# Patient Record
Sex: Female | Born: 1970 | Race: White | Hispanic: No | Marital: Single | Smoking: Never smoker
Health system: Southern US, Community
[De-identification: ages and names within clinical notes are randomized; demographics above are authoritative.]

## PROBLEM LIST (undated history)

## (undated) DIAGNOSIS — R1310 Dysphagia, unspecified: Secondary | ICD-10-CM

## (undated) DIAGNOSIS — Z8719 Personal history of other diseases of the digestive system: Secondary | ICD-10-CM

## (undated) DIAGNOSIS — M810 Age-related osteoporosis without current pathological fracture: Secondary | ICD-10-CM

## (undated) DIAGNOSIS — F419 Anxiety disorder, unspecified: Secondary | ICD-10-CM

## (undated) DIAGNOSIS — R519 Headache, unspecified: Secondary | ICD-10-CM

## (undated) DIAGNOSIS — F329 Major depressive disorder, single episode, unspecified: Secondary | ICD-10-CM

## (undated) DIAGNOSIS — G4730 Sleep apnea, unspecified: Secondary | ICD-10-CM

## (undated) DIAGNOSIS — F32A Depression, unspecified: Secondary | ICD-10-CM

## (undated) DIAGNOSIS — Z9889 Other specified postprocedural states: Secondary | ICD-10-CM

## (undated) DIAGNOSIS — K219 Gastro-esophageal reflux disease without esophagitis: Secondary | ICD-10-CM

## (undated) DIAGNOSIS — Z8711 Personal history of peptic ulcer disease: Secondary | ICD-10-CM

## (undated) DIAGNOSIS — E7800 Pure hypercholesterolemia, unspecified: Secondary | ICD-10-CM

## (undated) DIAGNOSIS — I10 Essential (primary) hypertension: Secondary | ICD-10-CM

## (undated) DIAGNOSIS — K589 Irritable bowel syndrome without diarrhea: Secondary | ICD-10-CM

## (undated) DIAGNOSIS — R51 Headache: Secondary | ICD-10-CM

## (undated) DIAGNOSIS — F509 Eating disorder, unspecified: Secondary | ICD-10-CM

## (undated) DIAGNOSIS — M1990 Unspecified osteoarthritis, unspecified site: Secondary | ICD-10-CM

## (undated) DIAGNOSIS — R112 Nausea with vomiting, unspecified: Secondary | ICD-10-CM

## (undated) HISTORY — PX: ESOPHAGEAL DILATION: SHX303

## (undated) HISTORY — DX: Unspecified osteoarthritis, unspecified site: M19.90

## (undated) HISTORY — PX: KNEE SURGERY: SHX244

## (undated) HISTORY — PX: MULTIPLE TOOTH EXTRACTIONS: SHX2053

## (undated) HISTORY — DX: Sleep apnea, unspecified: G47.30

## (undated) HISTORY — PX: CHOLECYSTECTOMY: SHX55

## (undated) HISTORY — DX: Personal history of other diseases of the digestive system: Z87.19

## (undated) HISTORY — DX: Irritable bowel syndrome, unspecified: K58.9

---

## 2002-07-14 ENCOUNTER — Emergency Department (HOSPITAL_COMMUNITY): Admission: EM | Admit: 2002-07-14 | Discharge: 2002-07-14 | Payer: Self-pay | Admitting: Emergency Medicine

## 2002-08-02 ENCOUNTER — Emergency Department (HOSPITAL_COMMUNITY): Admission: EM | Admit: 2002-08-02 | Discharge: 2002-08-02 | Payer: Self-pay | Admitting: Emergency Medicine

## 2002-08-02 ENCOUNTER — Encounter: Payer: Self-pay | Admitting: Emergency Medicine

## 2004-03-30 ENCOUNTER — Emergency Department (HOSPITAL_COMMUNITY): Admission: EM | Admit: 2004-03-30 | Discharge: 2004-03-30 | Payer: Self-pay | Admitting: Emergency Medicine

## 2007-10-16 ENCOUNTER — Ambulatory Visit (HOSPITAL_COMMUNITY): Admission: RE | Admit: 2007-10-16 | Discharge: 2007-10-16 | Payer: Self-pay | Admitting: Family Medicine

## 2012-12-16 ENCOUNTER — Emergency Department (HOSPITAL_COMMUNITY)
Admission: EM | Admit: 2012-12-16 | Discharge: 2012-12-17 | Disposition: A | Discharge status: Home / Self Care | Payer: BC Managed Care – PPO | Attending: Emergency Medicine | Admitting: Emergency Medicine

## 2012-12-16 ENCOUNTER — Encounter (HOSPITAL_COMMUNITY): Payer: Self-pay | Admitting: *Deleted

## 2012-12-16 ENCOUNTER — Emergency Department (HOSPITAL_COMMUNITY): Payer: BC Managed Care – PPO

## 2012-12-16 DIAGNOSIS — S9032XA Contusion of left foot, initial encounter: Secondary | ICD-10-CM

## 2012-12-16 DIAGNOSIS — Z87891 Personal history of nicotine dependence: Secondary | ICD-10-CM | POA: Insufficient documentation

## 2012-12-16 DIAGNOSIS — Z8781 Personal history of (healed) traumatic fracture: Secondary | ICD-10-CM | POA: Insufficient documentation

## 2012-12-16 DIAGNOSIS — W1692XA Jumping or diving into unspecified water causing other injury, initial encounter: Secondary | ICD-10-CM | POA: Insufficient documentation

## 2012-12-16 DIAGNOSIS — Y92009 Unspecified place in unspecified non-institutional (private) residence as the place of occurrence of the external cause: Secondary | ICD-10-CM | POA: Insufficient documentation

## 2012-12-16 DIAGNOSIS — Y9339 Activity, other involving climbing, rappelling and jumping off: Secondary | ICD-10-CM | POA: Insufficient documentation

## 2012-12-16 DIAGNOSIS — S9030XA Contusion of unspecified foot, initial encounter: Secondary | ICD-10-CM | POA: Insufficient documentation

## 2012-12-16 MED ORDER — OXYCODONE-ACETAMINOPHEN 5-325 MG PO TABS: 1.0000 | ORAL_TABLET | Freq: Four times a day (QID) | ORAL | Status: DC | PRN

## 2012-12-16 MED ORDER — IBUPROFEN 800 MG PO TABS: 800.0000 mg | ORAL_TABLET | Freq: Three times a day (TID) | ORAL | Status: DC

## 2012-12-16 MED ORDER — TRAMADOL HCL 50 MG PO TABS
50.0000 mg | ORAL_TABLET | Freq: Once | ORAL | Status: AC
  Administered 2012-12-16: 50 mg via ORAL
  Filled 2012-12-16: qty 1

## 2012-12-16 NOTE — ED Notes (Signed)
C/o left foot pain and not able to ambulate on it after jumping into a shallow pool flat footed today, has ice on area PTA

## 2012-12-16 NOTE — ED Provider Notes (Signed)
History    CSN: [id number] Arrival date & time 12/16/12  2158  First MD Initiated Contact with Patient 12/16/12 2242     Chief Complaint  Patient presents with  . Foot Injury   (Consider location/radiation/quality/duration/timing/severity/associated sxs/prior Treatment) Patient is a 42 y.o. female presenting with foot injury. The history is provided by the patient.  Foot Injury Location:  Foot Injury: yes   Mechanism of injury comment:  Jumped 3 ft  into a swimming pool and landed on her feet , with most of her weight on the left foot. Foot location:  L foot Pain details:    Quality:  Throbbing   Radiates to:  Does not radiate   Severity:  Moderate   Onset quality:  Sudden   Timing:  Constant Chronicity:  New Dislocation: no   Foreign body present:  No foreign bodies Prior injury to area:  Yes Worsened by:  Bearing weight Ineffective treatments:  NSAIDs Associated symptoms: decreased ROM and swelling   Associated symptoms: no back pain, no fever, no neck pain, no numbness, no stiffness and no tingling   Risk factors comment:  Hx of foot fracture 20 yrs ago.  History reviewed. No pertinent past medical history. Past Surgical History  Procedure Laterality Date  . Cholecystectomy     History reviewed. No pertinent family history. History  Substance Use Topics  . Smoking status: Former Games developer  . Smokeless tobacco: Not on file  . Alcohol Use: No   OB History   Grav Para Term Preterm Abortions TAB SAB Ect Mult Living                 Review of Systems  Constitutional: Negative for fever and chills.  HENT: Negative for neck pain.   Genitourinary: Negative for dysuria and difficulty urinating.  Musculoskeletal: Positive for joint swelling and arthralgias. Negative for back pain and stiffness.  Skin: Negative for color change and wound.  Neurological: Negative for dizziness, weakness and numbness.  All other systems reviewed and are negative.    Allergies   Vicodin and Sulfa antibiotics  Home Medications   Current Outpatient Rx  Name  Route  Sig  Dispense  Refill  . ibuprofen (ADVIL,MOTRIN) 200 MG tablet   Oral   Take 800 mg by mouth every 6 (six) hours as needed for pain.          BP 127/69  Pulse 71  Temp(Src) 97.8 F (36.6 C) (Oral)  Resp 18  Ht 5' 2.5" (1.588 m)  Wt 175 lb (79.379 kg)  BMI 31.48 kg/m2  SpO2 100%  LMP 12/10/2012 Physical Exam  Nursing note and vitals reviewed. Constitutional: She is oriented to person, place, and time. She appears well-developed and well-nourished. No distress.  HENT:  Head: Normocephalic and atraumatic.  Cardiovascular: Normal rate, regular rhythm, normal heart sounds and intact distal pulses.   No murmur heard. Pulmonary/Chest: Effort normal and breath sounds normal. No respiratory distress.  Musculoskeletal: She exhibits tenderness. She exhibits no edema.       Left foot: She exhibits tenderness. She exhibits no swelling, normal capillary refill, no crepitus, no deformity and no laceration.  ttp of the plantar surface of the left foot.  No significant edema, no bruising.   DP pulse is brisk, distal sensation intact.  Compartments soft, No erythema, abrasion, bruising or bony deformity of the foot.    Neurological: She is alert and oriented to person, place, and time. She exhibits normal muscle tone. Coordination normal.  Skin: Skin is warm and dry.    ED Course  Procedures (including critical care time) Labs Reviewed - No data to display Dg Foot Complete Left  12/16/2012   *RADIOLOGY REPORT*  Clinical Data: Injury as the patient jumped into the swimming pull. Plantar foot pain from the central large to the calcaneus.  LEFT FOOT - COMPLETE 3+ VIEW  Comparison: None.  Findings: Hallux valgus deformity and degenerative changes of the first metatarsophalangeal joint.  Plantar calcaneal spur.  Left foot appears otherwise intact. No evidence of acute fracture or subluxation.  No focal bone  lesions.  Bone matrix and cortex appear intact.  No abnormal radiopaque densities in the soft tissues.  IMPRESSION: No acute bony abnormalities demonstrated in the left foot.   Original Report Authenticated By: Burman Nieves, M.D.       MDM  Patient has ttp along the plantar surface of the lateral left foot.  NV intact,  No proximal tenderness.  No spinal tenderness.  X-ray tonight is neg for acute fx.    Post op shoe applied and pt given crutches.  She agrees to elevate, ice and close f/u with her orthopedist in [place].    Shila Kruczek L. Cecil Bixby, PA-C 12/16/12 2346

## 2012-12-16 NOTE — ED Provider Notes (Signed)
Medical screening examination/treatment/procedure(s) were performed by non-physician practitioner and as supervising physician I was immediately available for consultation/collaboration.   Shelda Jakes, MD 12/16/12 [phone]

## 2012-12-17 NOTE — ED Notes (Signed)
Pt alert & oriented x4, stable gait. Patient given discharge instructions, paperwork & prescription(s). Patient  instructed to stop at the registration desk to finish any additional paperwork. Patient verbalized understanding. Pt left department w/ no further questions. 

## 2013-05-14 ENCOUNTER — Emergency Department (HOSPITAL_COMMUNITY): Payer: BC Managed Care – PPO

## 2013-05-14 ENCOUNTER — Emergency Department (HOSPITAL_COMMUNITY)
Admission: EM | Admit: 2013-05-14 | Discharge: 2013-05-14 | Disposition: A | Discharge status: Home / Self Care | Payer: BC Managed Care – PPO | Attending: Emergency Medicine | Admitting: Emergency Medicine

## 2013-05-14 ENCOUNTER — Encounter (HOSPITAL_COMMUNITY): Payer: Self-pay | Admitting: Emergency Medicine

## 2013-05-14 DIAGNOSIS — H9209 Otalgia, unspecified ear: Secondary | ICD-10-CM | POA: Insufficient documentation

## 2013-05-14 DIAGNOSIS — H9201 Otalgia, right ear: Secondary | ICD-10-CM

## 2013-05-14 DIAGNOSIS — R1110 Vomiting, unspecified: Secondary | ICD-10-CM

## 2013-05-14 DIAGNOSIS — R42 Dizziness and giddiness: Secondary | ICD-10-CM | POA: Insufficient documentation

## 2013-05-14 DIAGNOSIS — R509 Fever, unspecified: Secondary | ICD-10-CM | POA: Insufficient documentation

## 2013-05-14 DIAGNOSIS — Z87891 Personal history of nicotine dependence: Secondary | ICD-10-CM | POA: Insufficient documentation

## 2013-05-14 DIAGNOSIS — R630 Anorexia: Secondary | ICD-10-CM | POA: Insufficient documentation

## 2013-05-14 DIAGNOSIS — K920 Hematemesis: Secondary | ICD-10-CM | POA: Insufficient documentation

## 2013-05-14 DIAGNOSIS — R05 Cough: Secondary | ICD-10-CM | POA: Insufficient documentation

## 2013-05-14 DIAGNOSIS — R0789 Other chest pain: Secondary | ICD-10-CM | POA: Insufficient documentation

## 2013-05-14 DIAGNOSIS — R079 Chest pain, unspecified: Secondary | ICD-10-CM

## 2013-05-14 DIAGNOSIS — Z791 Long term (current) use of non-steroidal anti-inflammatories (NSAID): Secondary | ICD-10-CM | POA: Insufficient documentation

## 2013-05-14 DIAGNOSIS — R059 Cough, unspecified: Secondary | ICD-10-CM | POA: Insufficient documentation

## 2013-05-14 DIAGNOSIS — F410 Panic disorder [episodic paroxysmal anxiety] without agoraphobia: Secondary | ICD-10-CM | POA: Insufficient documentation

## 2013-05-14 DIAGNOSIS — R0602 Shortness of breath: Secondary | ICD-10-CM | POA: Insufficient documentation

## 2013-05-14 DIAGNOSIS — Z3202 Encounter for pregnancy test, result negative: Secondary | ICD-10-CM | POA: Insufficient documentation

## 2013-05-14 HISTORY — DX: Eating disorder, unspecified: F50.9

## 2013-05-14 HISTORY — DX: Depression, unspecified: F32.A

## 2013-05-14 HISTORY — DX: Major depressive disorder, single episode, unspecified: F32.9

## 2013-05-14 HISTORY — DX: Anxiety disorder, unspecified: F41.9

## 2013-05-14 LAB — CBC WITH DIFFERENTIAL/PLATELET
Basophils Absolute: 0.2 10*3/uL — ABNORMAL HIGH (ref 0.0–0.1)
Basophils Relative: 2 % — ABNORMAL HIGH (ref 0–1)
Eosinophils Absolute: 0 10*3/uL (ref 0.0–0.7)
Eosinophils Relative: 0 % (ref 0–5)
HCT: 44.1 % (ref 36.0–46.0)
MCHC: 33.1 g/dL (ref 30.0–36.0)
MCV: 85.5 fL (ref 78.0–100.0)
Monocytes Absolute: 0.6 10*3/uL (ref 0.1–1.0)
Platelets: 175 10*3/uL (ref 150–400)
RDW: 13.7 % (ref 11.5–15.5)

## 2013-05-14 LAB — TROPONIN I: Troponin I: <0.3 ng/mL (ref <0.30)

## 2013-05-14 LAB — URINALYSIS, ROUTINE W REFLEX MICROSCOPIC
Glucose, UA: NEGATIVE mg/dL
Specific Gravity, Urine: 1.01 (ref 1.005–1.030)
Urobilinogen, UA: 0.2 mg/dL (ref 0.0–1.0)
pH: 7.5 (ref 5.0–8.0)

## 2013-05-14 LAB — COMPREHENSIVE METABOLIC PANEL
ALT: 71 U/L — ABNORMAL HIGH (ref 0–35)
AST: 56 U/L — ABNORMAL HIGH (ref 0–37)
Albumin: 4.3 g/dL (ref 3.5–5.2)
Calcium: 9.6 mg/dL (ref 8.4–10.5)
Creatinine, Ser: 0.97 mg/dL (ref 0.50–1.10)
GFR calc non Af Amer: 71 mL/min — ABNORMAL LOW (ref >90)
Sodium: 138 mEq/L (ref 135–145)
Total Protein: 8.7 g/dL — ABNORMAL HIGH (ref 6.0–8.3)

## 2013-05-14 LAB — URINE MICROSCOPIC-ADD ON

## 2013-05-14 LAB — PREGNANCY, URINE: Preg Test, Ur: NEGATIVE

## 2013-05-14 MED ORDER — ONDANSETRON HCL 4 MG/2ML IJ SOLN
4.0000 mg | Freq: Once | INTRAMUSCULAR | Status: AC
  Administered 2013-05-14: 4 mg via INTRAVENOUS
  Filled 2013-05-14: qty 2

## 2013-05-14 MED ORDER — KETOROLAC TROMETHAMINE 30 MG/ML IJ SOLN
30.0000 mg | Freq: Once | INTRAMUSCULAR | Status: AC
  Administered 2013-05-14: 30 mg via INTRAVENOUS
  Filled 2013-05-14: qty 1

## 2013-05-14 MED ORDER — LORAZEPAM 1 MG PO TABS: 1.0000 mg | ORAL_TABLET | Freq: Three times a day (TID) | ORAL | Status: DC | PRN

## 2013-05-14 MED ORDER — SODIUM CHLORIDE 0.9 % IV BOLUS (SEPSIS)
1000.0000 mL | Freq: Once | INTRAVENOUS | Status: AC
  Administered 2013-05-14: 1000 mL via INTRAVENOUS

## 2013-05-14 NOTE — ED Provider Notes (Signed)
This chart was scribed for Claudia Maw Ward, DO, by Yevette Edwards, ED Scribe. This patient was seen in room APA04/APA04 and the patient's care was started at 9:28 AM.   TIME SEEN: 9:28 AM  CHIEF COMPLAINT: Chest pain, shortness of breath, vomiting, anxiety, fever, cough, right ear pain  HPI: HPI Comments: Claudia Chapman is a 42 y.o. female with history of depression and anxiety who presents to the Emergency Department with multiple complaints. Patient is complaining of right-sided otalgia which increases with swallowing and which has persisted for several days. The pt reports a h/o chronic ear infections. Additionally, last month, the pt stopped taking xanax and a second anxiety medication, similar to prozac, because of the side effects including a decreased appetite, and she has since experienced increased panic attacks. She reports she has taken a few xanax in the past month. She has also experienced several episodes of hematemesis which she attributes to an ulcer. The pt states the hematemesis was bright red blood but only a few specks. She also has experienced a cough, SOB, dizziness, chest pain, fever and chills. In the ED, her temperature is 98.4 F. The pt reports she was in bed all day yesterday with chills which she contributed to a urine infection. She attributes the chest pain and shortness of breath to anxiety, and she reports that she is "always anxious." At bedside, she states she "feels like I cannot catch my breath." She reports that she always experiences baseline SOB and chest pain, and she characterizes the chest pain as "somone sitting on my chest."  The pt states the chest pain worsens with anxiety and better after relaxing. No radiation of her pain. Is not exertional or pleuritic. She states her chest pain and shortness of breath have been constant for 2 weeks. She denies any h/o stress tests or cardiac caths. She also denies a h/o cardiac disease, hypertension, DM, high cholesterol, blood  clots, recent surgeries, long flights, exogenous estrogen, fractures, hospitalization, tobacco use. She also denies HI and SI.   ROS: See HPI Constitutional: fever, chills Eyes: no drainage  ENT: right-sided otalgia; no runny nose   Cardiovascular:  chest pain  Resp: SOB; cough GI: emesis; hematemsis; no hematochezia GU: no dysuria Integumentary: no rash  Allergy: no hives  Musculoskeletal: no leg swelling  Neurological: dizziness; no slurred speech ROS otherwise negative  PAST MEDICAL HISTORY/PAST SURGICAL HISTORY:  History reviewed. No pertinent past medical history.  MEDICATIONS:  Prior to Admission medications   Medication Sig Start Date End Date Taking? Authorizing Provider  ibuprofen (ADVIL,MOTRIN) 200 MG tablet Take 800 mg by mouth every 6 (six) hours as needed for pain.    Historical Provider, MD  ibuprofen (ADVIL,MOTRIN) 800 MG tablet Take 1 tablet (800 mg total) by mouth 3 (three) times daily. 12/16/12   Tammy L. Triplett, PA-C  oxyCODONE-acetaminophen (PERCOCET/ROXICET) 5-325 MG per tablet Take 1 tablet by mouth every 6 (six) hours as needed for pain. 12/16/12   Tammy L. Triplett, PA-C    ALLERGIES:  Allergies  Allergen Reactions  . Vicodin [Hydrocodone-Acetaminophen] Shortness Of Breath and Swelling  . Sulfa Antibiotics     SOCIAL HISTORY:  History  Substance Use Topics  . Smoking status: Former Games developer  . Smokeless tobacco: Not on file  . Alcohol Use: No    FAMILY HISTORY: No family history on file.  No family history of premature CAD.  EXAM: BP 127/86  Pulse 88  Temp(Src) 98.4 F (36.9 C) (Oral)  Resp 16  Ht 5\' 2"  (1.575 m)  Wt 180 lb (81.647 kg)  BMI 32.91 kg/m2 CONSTITUTIONAL: Alert and oriented and responds appropriately to questions. Well-appearing; well-nourished HEAD: Normocephalic EYES: Conjunctivae clear, PERRL ENT: normal nose; no rhinorrhea; moist mucous membranes; pharynx without lesions noted; TMs have scarring bilaterally with no bulging  or purulece or erythema; no tonsilar hypertrophy or exudate  NECK: Supple, no meningismus, no LAD  CARD: RRR; S1 and S2 appreciated; no murmurs, no clicks, no rubs, no gallops RESP: Normal chest excursion without splinting or tachypnea; breath sounds clear and equal bilaterally; no wheezes, no rhonchi, no rales,  ABD/GI: Normal bowel sounds; non-distended; soft, non-tender, no rebound, no guarding BACK:  The back appears normal and is non-tender to palpation, there is no CVA tenderness EXT: Normal ROM in all joints; non-tender to palpation; no edema; normal capillary refill; no cyanosis    SKIN: Normal color for age and race; warm NEURO: Moves all extremities equally PSYCH: The patient's mood and manner are appropriate. Grooming and personal hygiene are appropriate.  MEDICAL DECISION MAKING: Patient here with multiple complaints including 2 weeks of constant chest tightness and shortness of breath and lightheadedness that she contributes to anxiety attacks. She denies any SI or HI. She is also complaining of several days of right-sided ear pain with cough and chills. Her exam is benign. She is hemodynamically stable. Her EKG is normal. Will check cardiac labs, chest x-ray. If workup unremarkable, have recommended outpatient followup with her primary care physician.  Patient's labs are reassuring. No leukocytosis. Troponin is negative. Her chest x-ray is clear. Urine is pending. She is complaining of mild headache. Will give Toradol, Zofran and IV fluids. Patient reports her primary care physician has called her in a prescription for Cipro for her ear infection.  Patient reports some improvement of symptoms. Her urine is unremarkable, urine pregnancy test negative. We'll discharge home with small prescription for Ativan for her panic attacks. Have given strict return precautions. Patient has PCP followup this week. She verbalized understanding and is comfortable with plan.  EKG Interpretation     Date/Time:  Tuesday May 14 2013 09:20:59 EST Ventricular Rate:  84 PR Interval:  166 QRS Duration: 84 QT Interval:  384 QTC Calculation: 453 R Axis:   -24 Text Interpretation:  Normal sinus rhythm Normal ECG No previous ECGs available Confirmed by WARD  DO, KRISTEN ([id number]) on 05/14/2013 9:47:33 AM               I personally performed the services described in this documentation, which was scribed in my presence. The recorded information has been reviewed and is accurate.    Claudia Maw Ward, DO 05/14/13 1341

## 2013-05-14 NOTE — ED Notes (Signed)
Pt alert & oriented x4, stable gait. Patient given discharge instructions, paperwork & prescription(s). Patient  instructed to stop at the registration desk to finish any additional paperwork. Patient verbalized understanding. Pt left department w/ no further questions. 

## 2013-05-14 NOTE — ED Notes (Signed)
Right ear pain, chest pain, sob, dizziness, cough, fever, chills, vomited x 1 last night, starting last night.  Reports cp x 2 wks.  States is vomiting blood but that comes from an eating disorder.

## 2013-07-07 ENCOUNTER — Emergency Department (HOSPITAL_COMMUNITY)
Admission: EM | Admit: 2013-07-07 | Discharge: 2013-07-07 | Disposition: A | Discharge status: Home / Self Care | Payer: BC Managed Care – PPO | Attending: Emergency Medicine | Admitting: Emergency Medicine

## 2013-07-07 ENCOUNTER — Encounter (HOSPITAL_COMMUNITY): Payer: Self-pay | Admitting: Emergency Medicine

## 2013-07-07 DIAGNOSIS — N898 Other specified noninflammatory disorders of vagina: Secondary | ICD-10-CM | POA: Insufficient documentation

## 2013-07-07 DIAGNOSIS — Z87891 Personal history of nicotine dependence: Secondary | ICD-10-CM | POA: Insufficient documentation

## 2013-07-07 DIAGNOSIS — F3289 Other specified depressive episodes: Secondary | ICD-10-CM | POA: Insufficient documentation

## 2013-07-07 DIAGNOSIS — Z3202 Encounter for pregnancy test, result negative: Secondary | ICD-10-CM | POA: Insufficient documentation

## 2013-07-07 DIAGNOSIS — N939 Abnormal uterine and vaginal bleeding, unspecified: Secondary | ICD-10-CM

## 2013-07-07 DIAGNOSIS — Z9089 Acquired absence of other organs: Secondary | ICD-10-CM | POA: Insufficient documentation

## 2013-07-07 DIAGNOSIS — Z79899 Other long term (current) drug therapy: Secondary | ICD-10-CM | POA: Insufficient documentation

## 2013-07-07 DIAGNOSIS — R1114 Bilious vomiting: Secondary | ICD-10-CM | POA: Insufficient documentation

## 2013-07-07 DIAGNOSIS — F411 Generalized anxiety disorder: Secondary | ICD-10-CM | POA: Insufficient documentation

## 2013-07-07 DIAGNOSIS — R110 Nausea: Secondary | ICD-10-CM | POA: Insufficient documentation

## 2013-07-07 DIAGNOSIS — Z8669 Personal history of other diseases of the nervous system and sense organs: Secondary | ICD-10-CM | POA: Insufficient documentation

## 2013-07-07 DIAGNOSIS — F329 Major depressive disorder, single episode, unspecified: Secondary | ICD-10-CM | POA: Insufficient documentation

## 2013-07-07 LAB — URINALYSIS, ROUTINE W REFLEX MICROSCOPIC
Glucose, UA: NEGATIVE mg/dL
Ketones, ur: NEGATIVE mg/dL
Leukocytes, UA: NEGATIVE
NITRITE: NEGATIVE
PH: 6 (ref 5.0–8.0)
Protein, ur: NEGATIVE mg/dL
Urobilinogen, UA: 0.2 mg/dL (ref 0.0–1.0)

## 2013-07-07 LAB — CBC WITH DIFFERENTIAL/PLATELET
BASOS ABS: 0 10*3/uL (ref 0.0–0.1)
BASOS PCT: 0 % (ref 0–1)
Eosinophils Absolute: 0.1 10*3/uL (ref 0.0–0.7)
Eosinophils Relative: 1 % (ref 0–5)
HCT: 38.4 % (ref 36.0–46.0)
Hemoglobin: 13.1 g/dL (ref 12.0–15.0)
Lymphocytes Relative: 18 % (ref 12–46)
Lymphs Abs: 2.6 10*3/uL (ref 0.7–4.0)
MCH: 28.8 pg (ref 26.0–34.0)
MCHC: 34.1 g/dL (ref 30.0–36.0)
MCV: 84.4 fL (ref 78.0–100.0)
MONO ABS: 0.7 10*3/uL (ref 0.1–1.0)
Monocytes Relative: 5 % (ref 3–12)
Neutro Abs: 11.1 10*3/uL — ABNORMAL HIGH (ref 1.7–7.7)
Neutrophils Relative %: 77 % (ref 43–77)
PLATELETS: 235 10*3/uL (ref 150–400)
RBC: 4.55 MIL/uL (ref 3.87–5.11)
RDW: 13.4 % (ref 11.5–15.5)
WBC: 14.5 10*3/uL — ABNORMAL HIGH (ref 4.0–10.5)

## 2013-07-07 LAB — COMPREHENSIVE METABOLIC PANEL
ALBUMIN: 3.7 g/dL (ref 3.5–5.2)
ALT: 12 U/L (ref 0–35)
AST: 14 U/L (ref 0–37)
Alkaline Phosphatase: 75 U/L (ref 39–117)
BUN: 8 mg/dL (ref 6–23)
CALCIUM: 8.8 mg/dL (ref 8.4–10.5)
CO2: 24 mEq/L (ref 19–32)
CREATININE: 0.9 mg/dL (ref 0.50–1.10)
Chloride: 105 mEq/L (ref 96–112)
GFR calc Af Amer: >90 mL/min (ref >90)
GFR calc non Af Amer: 78 mL/min — ABNORMAL LOW (ref >90)
Glucose, Bld: 99 mg/dL (ref 70–99)
Potassium: 3.8 mEq/L (ref 3.7–5.3)
SODIUM: 140 meq/L (ref 137–147)
TOTAL PROTEIN: 7.1 g/dL (ref 6.0–8.3)
Total Bilirubin: 0.6 mg/dL (ref 0.3–1.2)

## 2013-07-07 LAB — WET PREP, GENITAL
CLUE CELLS WET PREP: NONE SEEN
Trich, Wet Prep: NONE SEEN
WBC WET PREP: NONE SEEN
Yeast Wet Prep HPF POC: NONE SEEN

## 2013-07-07 LAB — PREGNANCY, URINE: Preg Test, Ur: NEGATIVE

## 2013-07-07 LAB — URINE MICROSCOPIC-ADD ON

## 2013-07-07 LAB — PROTIME-INR
INR: 0.99 (ref 0.00–1.49)
Prothrombin Time: 12.9 seconds (ref 11.6–15.2)

## 2013-07-07 MED ORDER — OXYCODONE-ACETAMINOPHEN 5-325 MG PO TABS: 2.0000 | ORAL_TABLET | ORAL | Status: DC | PRN

## 2013-07-07 MED ORDER — OXYCODONE-ACETAMINOPHEN 5-325 MG PO TABS
2.0000 | ORAL_TABLET | Freq: Once | ORAL | Status: AC
  Administered 2013-07-07: 2 via ORAL
  Filled 2013-07-07: qty 2

## 2013-07-07 NOTE — ED Notes (Signed)
Pt c/o lower abd pain, heavy vaginal bleeding, weakness for the past 4 days, states that she was "early" for this period, denies any possibility of pregnancy,

## 2013-07-07 NOTE — ED Provider Notes (Signed)
CSN: [id number]     Arrival date & time 07/07/13  1051 History  This chart was scribed for Glynn OctaveStephen Lavanna Rog, MD by Quintella ReichertMatthew Underwood, ED scribe.  This patient was seen in room APA01/APA01 and the patient's care was started at 11:39 AM.   Chief Complaint  Patient presents with  . Vaginal Bleeding  . Abdominal Pain    The history is provided by the patient. No language interpreter was used.    HPI Comments: Claudia Chapman is a 43 y.o. female who presents to the Emergency Department complaining of progressively-worsening menorrhagia that began 4 days ago with associated lower abdominal pain.  Pt states her period came on slightly early 5 days ago and the next day her bleeding began growing progressively heavier.  Since then she has been changing her pad approximately every 30-45 minutes, including at night.  She states she has noticed "big black clots" in her bleeding as well.  She also complains of lower abdominal pain which she has attempted to treat at home with Tylenol, Aleve and Midol without relief.  She also states she feels slightly lightheaded and nauseated.  In addition she complains of mild "stinging" on urination.  She denies CP, SOB, or back pain.  Pt denies prior h/o similar symptoms.  She is not on blood-thinners.  She denies h/o fibroids or endometriosis.  She denies possibility of pregnancy  She admits to h/o cholecystectomy but denies any other abdominal surgeries.    Past Medical History  Diagnosis Date  . Anxiety   . Depression   . Eating disorder     Past Surgical History  Procedure Laterality Date  . Cholecystectomy      No family history on file.   History  Substance Use Topics  . Smoking status: Former Games developermoker  . Smokeless tobacco: Not on file  . Alcohol Use: No    OB History   Grav Para Term Preterm Abortions TAB SAB Ect Mult Living                  Review of Systems A complete 10 system review of systems was obtained and all systems are negative except  as noted in the HPI and PMH.    Allergies  Vicodin and Sulfa antibiotics  Home Medications   Current Outpatient Rx  Name  Route  Sig  Dispense  Refill  . Biotin 10 MG TABS   Oral   Take 1 tablet by mouth daily.         . diazepam (VALIUM) 5 MG tablet   Oral   Take 5 mg by mouth every 12 (twelve) hours as needed for anxiety.         Marland Kitchen. escitalopram (LEXAPRO) 20 MG tablet   Oral   Take 20 mg by mouth daily.         Marland Kitchen. ibuprofen (ADVIL,MOTRIN) 200 MG tablet   Oral   Take 800 mg by mouth every 6 (six) hours as needed for pain.         . Iron-Vit C-Vit B12-Folic Acid (IRON 100 PLUS PO)   Oral   Take 1 tablet by mouth daily.         . Multiple Vitamin (MULTIVITAMIN WITH MINERALS) TABS tablet   Oral   Take 1 tablet by mouth daily.         . Potassium (POTASSIMIN PO)   Oral   Take 1 tablet by mouth daily.         .Marland Kitchen  oxyCODONE-acetaminophen (PERCOCET/ROXICET) 5-325 MG per tablet   Oral   Take 2 tablets by mouth every 4 (four) hours as needed for severe pain.   15 tablet   0    BP 119/77  Pulse 80  Temp(Src) 98.3 F (36.8 C) (Oral)  Resp 16  Ht 5\' 2"  (1.575 m)  Wt 172 lb (78.019 kg)  BMI 31.45 kg/m2  SpO2 98%  LMP 07/01/2013  Physical Exam  Nursing note and vitals reviewed. Constitutional: She is oriented to person, place, and time. She appears well-developed and well-nourished. No distress.  HENT:  Head: Normocephalic and atraumatic.  Eyes: EOM are normal.  Neck: Neck supple. No tracheal deviation present.  Cardiovascular: Normal rate and regular rhythm.   No murmur heard. Pulmonary/Chest: Effort normal. No respiratory distress.  Abdominal: There is tenderness (mild, lower). There is no rebound and no guarding.  Genitourinary: No vaginal discharge found.  Normal external genitalia. Dark blood in vaginal vault. Cervix is closed with no ongoing bleeding. No CMT, no adnexal tenderness Chaperone present  Musculoskeletal: Normal range of motion.   Neurological: She is alert and oriented to person, place, and time.  Skin: Skin is warm and dry.  Psychiatric: She has a normal mood and affect. Her behavior is normal.    ED Course  Procedures (including critical care time)  DIAGNOSTIC STUDIES: Oxygen Saturation is 98% on room air, normal by my interpretation.    COORDINATION OF CARE: 11:44 AM-Discussed treatment plan which includes pelvic exam, pain medication and labs with pt at bedside and pt agreed to plan.    Labs Review Labs Reviewed  URINALYSIS, ROUTINE W REFLEX MICROSCOPIC - Abnormal; Notable for the following:    APPearance HAZY (*)    Specific Gravity, Urine >1.030 (*)    Hgb urine dipstick TRACE (*)    Bilirubin Urine SMALL (*)    All other components within normal limits  CBC WITH DIFFERENTIAL - Abnormal; Notable for the following:    WBC 14.5 (*)    Neutro Abs 11.1 (*)    All other components within normal limits  COMPREHENSIVE METABOLIC PANEL - Abnormal; Notable for the following:    GFR calc non Af Amer 78 (*)    All other components within normal limits  WET PREP, GENITAL  GC/CHLAMYDIA PROBE AMP  PREGNANCY, URINE  PROTIME-INR  URINE MICROSCOPIC-ADD ON   Imaging Review No results found.  EKG Interpretation   None       MDM   1. Vaginal bleeding    Lower abdominal pain with heavy vaginal bleeding x 4 days.  Abdomen soft and nontender.  No chest pain or SOB.  Hemoglobin is stable. Orthostatics negative. Record review patient had pelvic ultrasound 2009 that showed abnormality of cervix. Patient does not recall this and states this was never addressed. Her cervix appears normal on gross inspection. She'll be scheduled for an outpatient ultrasound to further evaluate her vaginal bleeding.   Her vitals are stable, she is tolerating PO. She will follow up for [place] and make an appointment with GYN.  She is instructed to return with worsening bleeding, chest pain, dizziness, lightheadedness, or any other  concerns.  BP 139/70  Pulse 56  Temp(Src) 97.8 F (36.6 C) (Oral)  Resp 18  Ht 5\' 2"  (1.575 m)  Wt 172 lb (78.019 kg)  BMI 31.45 kg/m2  SpO2 97%  LMP 07/01/2013  I personally performed the services described in this documentation, which was scribed in my presence. The recorded information has  been reviewed and is accurate.    Glynn Octave, MD 07/07/13 [phone]

## 2013-07-07 NOTE — Discharge Instructions (Signed)
Abnormal Uterine Bleeding Follow up for an ultrasound tomorrow. Follow up with Dr. Emelda FearFerguson. Return to the ED if you develop worsening bleeding, pain, shortness of breath, chest pain or any other concerns. Abnormal uterine bleeding means bleeding from the vagina that is not your normal menstrual period. This can be:  Bleeding or spotting between periods.  Bleeding after sex (sexual intercourse).  Bleeding that is heavier or more than normal.  Periods that last longer than usual.  Bleeding after menopause. There are many problems that may cause this. Treatment will depend on the cause of the bleeding. Any kind of bleeding that is not normal should be reviewed by your doctor.  HOME CARE Watch your condition for any changes. These actions may lessen any discomfort you are having:  Do not use tampons or douches as told by your doctor.  Change your pads often. You should get regular pelvic exams and Pap tests. Keep all appointments for tests as told by your doctor. GET HELP IF:  You are bleeding for more than 1 week.  You feel dizzy at times. GET HELP RIGHT AWAY IF:   You pass out.  You have to change pads every 15 to 30 minutes.  You have belly pain.  You have a fever.  You become sweaty or weak.  You are passing large blood clots from the vagina.  You feel sick to your stomach (nauseous) and throw up (vomit). MAKE SURE YOU:  Understand these instructions.  Will watch your condition.  Will get help right away if you are not doing well or get worse. Document Released: 03/27/2009 Document Revised: 03/20/2013 Document Reviewed: 12/27/2012 [facility] Patient Information 2014 [employer], [place].

## 2013-07-08 ENCOUNTER — Other Ambulatory Visit (HOSPITAL_COMMUNITY): Payer: Self-pay | Admitting: Emergency Medicine

## 2013-07-08 ENCOUNTER — Ambulatory Visit (HOSPITAL_COMMUNITY)
Admit: 2013-07-08 | Discharge: 2013-07-08 | Disposition: A | Discharge status: Home / Self Care | Payer: BC Managed Care – PPO | Source: Ambulatory Visit | Attending: Emergency Medicine | Admitting: Emergency Medicine

## 2013-07-08 DIAGNOSIS — N926 Irregular menstruation, unspecified: Secondary | ICD-10-CM | POA: Insufficient documentation

## 2013-07-08 DIAGNOSIS — R102 Pelvic and perineal pain: Secondary | ICD-10-CM

## 2013-07-08 DIAGNOSIS — N920 Excessive and frequent menstruation with regular cycle: Secondary | ICD-10-CM | POA: Insufficient documentation

## 2013-07-08 DIAGNOSIS — N83209 Unspecified ovarian cyst, unspecified side: Secondary | ICD-10-CM | POA: Insufficient documentation

## 2013-07-08 DIAGNOSIS — N949 Unspecified condition associated with female genital organs and menstrual cycle: Secondary | ICD-10-CM | POA: Insufficient documentation

## 2013-07-08 LAB — GC/CHLAMYDIA PROBE AMP
CT Probe RNA: NEGATIVE
GC Probe RNA: NEGATIVE

## 2015-09-29 ENCOUNTER — Ambulatory Visit (HOSPITAL_COMMUNITY): Payer: Self-pay | Admitting: Oncology

## 2016-06-10 ENCOUNTER — Encounter: Payer: Self-pay | Admitting: Physician Assistant

## 2016-06-22 ENCOUNTER — Ambulatory Visit: Payer: Self-pay | Admitting: Physician Assistant

## 2017-01-09 ENCOUNTER — Other Ambulatory Visit: Payer: Self-pay | Admitting: Neurological Surgery

## 2017-02-02 ENCOUNTER — Other Ambulatory Visit (HOSPITAL_COMMUNITY): Payer: Self-pay

## 2017-02-02 ENCOUNTER — Encounter (HOSPITAL_COMMUNITY): Payer: Self-pay

## 2017-02-02 ENCOUNTER — Encounter (HOSPITAL_COMMUNITY)
Admission: RE | Admit: 2017-02-02 | Discharge: 2017-02-02 | Disposition: A | Discharge status: Home / Self Care | Payer: BLUE CROSS/BLUE SHIELD | Source: Ambulatory Visit | Attending: Neurological Surgery | Admitting: Neurological Surgery

## 2017-02-02 DIAGNOSIS — I10 Essential (primary) hypertension: Secondary | ICD-10-CM | POA: Diagnosis not present

## 2017-02-02 DIAGNOSIS — Z01810 Encounter for preprocedural cardiovascular examination: Secondary | ICD-10-CM | POA: Insufficient documentation

## 2017-02-02 DIAGNOSIS — Z01812 Encounter for preprocedural laboratory examination: Secondary | ICD-10-CM | POA: Insufficient documentation

## 2017-02-02 HISTORY — DX: Headache: R51

## 2017-02-02 HISTORY — DX: Gastro-esophageal reflux disease without esophagitis: K21.9

## 2017-02-02 HISTORY — DX: Personal history of peptic ulcer disease: Z87.11

## 2017-02-02 HISTORY — DX: Essential (primary) hypertension: I10

## 2017-02-02 HISTORY — DX: Age-related osteoporosis without current pathological fracture: M81.0

## 2017-02-02 HISTORY — DX: Nausea with vomiting, unspecified: Z98.890

## 2017-02-02 HISTORY — DX: Nausea with vomiting, unspecified: R11.2

## 2017-02-02 HISTORY — DX: Dysphagia, unspecified: R13.10

## 2017-02-02 HISTORY — DX: Headache, unspecified: R51.9

## 2017-02-02 HISTORY — DX: Pure hypercholesterolemia, unspecified: E78.00

## 2017-02-02 HISTORY — DX: Personal history of other diseases of the digestive system: Z87.19

## 2017-02-02 LAB — BASIC METABOLIC PANEL
ANION GAP: 12 (ref 5–15)
BUN: 7 mg/dL (ref 6–20)
CALCIUM: 9.2 mg/dL (ref 8.9–10.3)
CO2: 20 mmol/L — ABNORMAL LOW (ref 22–32)
CREATININE: 0.8 mg/dL (ref 0.44–1.00)
Chloride: 104 mmol/L (ref 101–111)
GFR calc Af Amer: >60 mL/min (ref >60)
GLUCOSE: 101 mg/dL — AB (ref 65–99)
Potassium: 3.8 mmol/L (ref 3.5–5.1)
Sodium: 136 mmol/L (ref 135–145)

## 2017-02-02 LAB — CBC
HCT: 37.4 % (ref 36.0–46.0)
HEMOGLOBIN: 12 g/dL (ref 12.0–15.0)
MCH: 23.8 pg — ABNORMAL LOW (ref 26.0–34.0)
MCHC: 32.1 g/dL (ref 30.0–36.0)
MCV: 74.2 fL — ABNORMAL LOW (ref 78.0–100.0)
PLATELETS: 344 10*3/uL (ref 150–400)
RBC: 5.04 MIL/uL (ref 3.87–5.11)
RDW: 15.7 % — AB (ref 11.5–15.5)
WBC: 15.8 10*3/uL — ABNORMAL HIGH (ref 4.0–10.5)

## 2017-02-02 LAB — SURGICAL PCR SCREEN
MRSA, PCR: NEGATIVE
STAPHYLOCOCCUS AUREUS: POSITIVE — AB

## 2017-02-02 LAB — HCG, SERUM, QUALITATIVE: PREG SERUM: NEGATIVE

## 2017-02-02 NOTE — Progress Notes (Signed)
PCP - Annie Main NP Cardiologist - patient denies  Chest x-ray - n/a EKG - 02/02/2017 Stress Test - patient denies ECHO - patient denies Cardiac Cath - patient denies  Sleep Study - patient denies, scored 5 on stop bang   Patient denies shortness of breath, fever, cough and chest pain at PAT appointment   Patient verbalized understanding of instructions that were given to them at the PAT appointment. Patient was also instructed that they will need to review over the PAT instructions again at home before surgery.

## 2017-02-02 NOTE — Pre-Procedure Instructions (Addendum)
SHAILEE FOOTS  02/02/2017      [facility] - [place], [place] - [place], [place] - [address] [place] [address] Phone: [phone] Fax: [phone]  [facility] - [place], [place] - [address] [place] [address] [place] [address] Phone: [phone] Fax: [phone]    Your procedure is scheduled on Friday, February 10, 2017.  Report to [facility] Admitting at 10:00 A.M.  Call this number if you have problems the morning of surgery:  [phone]   Remember:  Do not eat food or drink liquids after midnight.  Continue all other medications as directed by your physician except follow these instructions about your medications   Take these medicines the morning of surgery with A SIP OF WATER Tylenol - if needed Xanax - if needed Tramadol - if needed  7 days prior to surgery STOP taking any Aspirin, Aleve, Naproxen, Ibuprofen, Motrin, Advil, Goody's, BC's, all herbal medications, fish oil, and all vitamins, including your Meloxicam    Do not wear jewelry, make-up or nail polish.  Do not wear lotions, powders, or perfumes, or deodorant.  Do not shave 48 hours prior to surgery.    Do not bring valuables to the hospital.  [facility] is not responsible for any belongings or valuables.  Contacts, eyeglasses, dentures or bridgework may not be worn into surgery.  Leave your suitcase in the car.  After surgery it may be brought to your room.  For patients admitted to the hospital, discharge time will be determined by your treatment team.  Patients discharged the day of surgery will not be allowed to drive home.   Name and phone number of your driver:    Special instructions:   [facility]- Preparing For Surgery  Before surgery, you can play an important role. Because skin is not sterile, your skin needs to be as free of germs as possible. You can reduce the number of germs on your skin by washing with CHG (chlorahexidine  gluconate) Soap before surgery.  CHG is an antiseptic cleaner which kills germs and bonds with the skin to continue killing germs even after washing.  Please do not use if you have an allergy to CHG or antibacterial soaps. If your skin becomes reddened/irritated stop using the CHG.  Do not shave (including legs and underarms) for at least 48 hours prior to first CHG shower. It is OK to shave your face.  Please follow these instructions carefully.   1. Shower the NIGHT BEFORE SURGERY and the MORNING OF SURGERY with CHG.   2. If you chose to wash your hair, wash your hair first as usual with your normal shampoo.  3. After you shampoo, rinse your hair and body thoroughly to remove the shampoo.  4. Use CHG as you would any other liquid soap. You can apply CHG directly to the skin and wash gently with a scrungie or a clean washcloth.   5. Apply the CHG Soap to your body ONLY FROM THE NECK DOWN.  Do not use on open wounds or open sores. Avoid contact with your eyes, ears, mouth and genitals (private parts). Wash genitals (private parts) with your normal soap.  6. Wash thoroughly, paying special attention to the area where your surgery will be performed.  7. Thoroughly rinse your body with warm water from the neck down.  8. DO NOT shower/wash with your normal soap after  using and rinsing off the CHG Soap.  9. Pat yourself dry with a CLEAN TOWEL.   10. Wear CLEAN PAJAMAS   11. Place CLEAN SHEETS on your bed the night of your first shower and DO NOT SLEEP WITH PETS.    Day of Surgery: Shower as stated above Do not apply any deodorants/lotions. Please wear clean clothes to the hospital/surgery center.      Please read over the following fact sheets that you were given. Pain Booklet, Coughing and Deep Breathing, MRSA Information and Surgical Site Infection Prevention

## 2017-02-02 NOTE — Progress Notes (Signed)
   02/02/17 1609  OBSTRUCTIVE SLEEP APNEA  Have you ever been diagnosed with sleep apnea through a sleep study? No  Do you snore loudly (loud enough to be heard through closed doors)?  1  Do you often feel tired, fatigued, or sleepy during the daytime (such as falling asleep during driving or talking to someone)? 1  Has anyone observed you stop breathing during your sleep? 1  Do you have, or are you being treated for high blood pressure? 1  BMI more than 35 kg/m2? 1  Age > 50 (1-yes) 0  Neck circumference greater than:Female 16 inches or larger, Female 17inches or larger? 0  Female Gender (Yes=1) 0  Obstructive Sleep Apnea Score 5  Score 5 or greater  Results sent to PCP

## 2017-02-03 NOTE — Progress Notes (Signed)
I called a prescription for Mupirocin ointment to [facility], [place], [place].

## 2017-02-03 NOTE — Progress Notes (Addendum)
Anesthesia Chart Review:  Pt is a 46 year old female scheduled for L4-5, L5-S1 microdiscectomy on 02/10/2017 with Marikay Alar, MD  - PCP is Irena Reichmann, DO (notes in care everywhere)   PMH includes:  HTN, hyperlipidemia, post-op N/V, GERD.  Never smoker. BMI 38  Medications include: lipitor, maxzide  BP (!) 129/95   Pulse 81   Temp (!) 36.4 C   Resp 20   Ht 5\' 1"  (1.549 m)   Wt 201 lb 11.2 oz (91.5 kg)   SpO2 95%   BMI 38.11 kg/m   Preoperative labs reviewed.  WBC 15.8.    EKG 02/02/17: NSR. LAD. RA enlargement  I notified Erie Noe in Dr. Yetta Barre' office of elevated WBC count.   I spoke with pt by telephone.  She denies any signs/sx of acute infection.  She does report she's been told her WBC count has been high in the past.  Previous results in Epic and care everywhere show WBC count was 14.4-14.5 in 2015 and 2017.  I encouraged pt to f/u with PCP about elevated WBC count.   If no changes, I anticipate pt can proceed with surgery as scheduled.   Rica Mast, FNP-BC [facility] Short Stay Surgical Center/Anesthesiology Phone: [phone] 02/03/2017 4:28 PM

## 2017-02-09 NOTE — Progress Notes (Signed)
Pt reports that she has started her period and is concerned that she will be on her period and is having surgery tomorrow. She is concerned that it may effect her having surgery. I advised that it should not effect her surgery but if she is having excessive bleeding and/or pain then she should go to the ED. She said that she was not at this time and would be here tomorrow as scheduled

## 2017-02-10 ENCOUNTER — Observation Stay (HOSPITAL_COMMUNITY)
Admission: RE | Admit: 2017-02-10 | Discharge: 2017-02-11 | Disposition: A | Discharge status: Home / Self Care | Payer: BLUE CROSS/BLUE SHIELD | Source: Ambulatory Visit | Attending: Neurological Surgery | Admitting: Neurological Surgery

## 2017-02-10 ENCOUNTER — Ambulatory Visit (HOSPITAL_COMMUNITY)
Admission: RE | Disposition: A | Discharge status: Home / Self Care | Payer: Self-pay | Source: Ambulatory Visit | Attending: Neurological Surgery

## 2017-02-10 ENCOUNTER — Ambulatory Visit (HOSPITAL_COMMUNITY): Payer: BLUE CROSS/BLUE SHIELD | Admitting: Emergency Medicine

## 2017-02-10 ENCOUNTER — Encounter (HOSPITAL_COMMUNITY): Payer: Self-pay

## 2017-02-10 ENCOUNTER — Ambulatory Visit (HOSPITAL_COMMUNITY): Payer: BLUE CROSS/BLUE SHIELD

## 2017-02-10 ENCOUNTER — Observation Stay (HOSPITAL_COMMUNITY): Payer: BLUE CROSS/BLUE SHIELD

## 2017-02-10 DIAGNOSIS — F419 Anxiety disorder, unspecified: Secondary | ICD-10-CM | POA: Diagnosis not present

## 2017-02-10 DIAGNOSIS — M5116 Intervertebral disc disorders with radiculopathy, lumbar region: Secondary | ICD-10-CM | POA: Diagnosis not present

## 2017-02-10 DIAGNOSIS — M5126 Other intervertebral disc displacement, lumbar region: Secondary | ICD-10-CM

## 2017-02-10 DIAGNOSIS — Z9889 Other specified postprocedural states: Secondary | ICD-10-CM

## 2017-02-10 DIAGNOSIS — E7800 Pure hypercholesterolemia, unspecified: Secondary | ICD-10-CM | POA: Diagnosis not present

## 2017-02-10 DIAGNOSIS — Z885 Allergy status to narcotic agent status: Secondary | ICD-10-CM | POA: Insufficient documentation

## 2017-02-10 DIAGNOSIS — M48061 Spinal stenosis, lumbar region without neurogenic claudication: Secondary | ICD-10-CM | POA: Diagnosis not present

## 2017-02-10 DIAGNOSIS — Z79899 Other long term (current) drug therapy: Secondary | ICD-10-CM | POA: Diagnosis not present

## 2017-02-10 DIAGNOSIS — K219 Gastro-esophageal reflux disease without esophagitis: Secondary | ICD-10-CM | POA: Diagnosis not present

## 2017-02-10 DIAGNOSIS — Z6837 Body mass index (BMI) 37.0-37.9, adult: Secondary | ICD-10-CM | POA: Diagnosis not present

## 2017-02-10 DIAGNOSIS — F329 Major depressive disorder, single episode, unspecified: Secondary | ICD-10-CM | POA: Diagnosis not present

## 2017-02-10 DIAGNOSIS — M4807 Spinal stenosis, lumbosacral region: Secondary | ICD-10-CM | POA: Insufficient documentation

## 2017-02-10 DIAGNOSIS — Z8719 Personal history of other diseases of the digestive system: Secondary | ICD-10-CM | POA: Diagnosis not present

## 2017-02-10 DIAGNOSIS — Z419 Encounter for procedure for purposes other than remedying health state, unspecified: Secondary | ICD-10-CM

## 2017-02-10 DIAGNOSIS — I10 Essential (primary) hypertension: Secondary | ICD-10-CM | POA: Insufficient documentation

## 2017-02-10 HISTORY — PX: LUMBAR LAMINECTOMY/DECOMPRESSION MICRODISCECTOMY: SHX5026

## 2017-02-10 LAB — PROTIME-INR
INR: 0.91
Prothrombin Time: 12.1 seconds (ref 11.4–15.2)

## 2017-02-10 SURGERY — LUMBAR LAMINECTOMY/DECOMPRESSION MICRODISCECTOMY 2 LEVELS
Anesthesia: General | Site: Back | Laterality: Right

## 2017-02-10 MED ORDER — SENNA 8.6 MG PO TABS
1.0000 | ORAL_TABLET | Freq: Two times a day (BID) | ORAL | Status: DC
  Administered 2017-02-10: 8.6 mg via ORAL
  Filled 2017-02-10: qty 1

## 2017-02-10 MED ORDER — POTASSIUM CHLORIDE IN NACL 20-0.9 MEQ/L-% IV SOLN: INTRAVENOUS | Status: DC

## 2017-02-10 MED ORDER — ONDANSETRON HCL 4 MG/2ML IJ SOLN
INTRAMUSCULAR | Status: AC
  Filled 2017-02-10: qty 2

## 2017-02-10 MED ORDER — ACETAMINOPHEN 325 MG PO TABS: 650.0000 mg | ORAL_TABLET | ORAL | Status: DC | PRN

## 2017-02-10 MED ORDER — TRIAMTERENE-HCTZ 37.5-25 MG PO TABS
1.0000 | ORAL_TABLET | Freq: Every day | ORAL | Status: DC
  Administered 2017-02-10 – 2017-02-11 (×2): 1 via ORAL
  Filled 2017-02-10 (×2): qty 1

## 2017-02-10 MED ORDER — CHLORHEXIDINE GLUCONATE CLOTH 2 % EX PADS: 6.0000 | MEDICATED_PAD | Freq: Once | CUTANEOUS | Status: DC

## 2017-02-10 MED ORDER — HYDROMORPHONE HCL 1 MG/ML IJ SOLN
INTRAMUSCULAR | Status: AC
  Administered 2017-02-10: 0.5 mg via INTRAVENOUS
  Filled 2017-02-10: qty 1

## 2017-02-10 MED ORDER — THROMBIN 5000 UNITS EX SOLR
CUTANEOUS | Status: AC
  Filled 2017-02-10: qty 5000

## 2017-02-10 MED ORDER — THROMBIN 20000 UNITS EX KIT
PACK | CUTANEOUS | Status: DC | PRN
  Administered 2017-02-10: 10000 [IU] via TOPICAL

## 2017-02-10 MED ORDER — PHENYLEPHRINE 40 MCG/ML (10ML) SYRINGE FOR IV PUSH (FOR BLOOD PRESSURE SUPPORT)
PREFILLED_SYRINGE | INTRAVENOUS | Status: AC
  Filled 2017-02-10: qty 10

## 2017-02-10 MED ORDER — ROCURONIUM BROMIDE 100 MG/10ML IV SOLN
INTRAVENOUS | Status: DC | PRN
  Administered 2017-02-10: 55 mg via INTRAVENOUS
  Administered 2017-02-10: 10 mg via INTRAVENOUS

## 2017-02-10 MED ORDER — MIDAZOLAM HCL 2 MG/2ML IJ SOLN
INTRAMUSCULAR | Status: AC
  Filled 2017-02-10: qty 2

## 2017-02-10 MED ORDER — 0.9 % SODIUM CHLORIDE (POUR BTL) OPTIME
TOPICAL | Status: DC | PRN
  Administered 2017-02-10: 1000 mL

## 2017-02-10 MED ORDER — ONDANSETRON HCL 4 MG/2ML IJ SOLN
INTRAMUSCULAR | Status: DC | PRN
  Administered 2017-02-10: 4 mg via INTRAVENOUS

## 2017-02-10 MED ORDER — ALPRAZOLAM 0.25 MG PO TABS
0.2500 mg | ORAL_TABLET | Freq: Two times a day (BID) | ORAL | Status: DC | PRN
  Administered 2017-02-11: 0.25 mg via ORAL
  Filled 2017-02-10: qty 1

## 2017-02-10 MED ORDER — LACTATED RINGERS IV SOLN
INTRAVENOUS | Status: DC | PRN
  Administered 2017-02-10: 12:00:00 via INTRAVENOUS

## 2017-02-10 MED ORDER — LIDOCAINE 2% (20 MG/ML) 5 ML SYRINGE
INTRAMUSCULAR | Status: AC
  Filled 2017-02-10: qty 5

## 2017-02-10 MED ORDER — CEFAZOLIN SODIUM-DEXTROSE 2-4 GM/100ML-% IV SOLN
2.0000 g | INTRAVENOUS | Status: AC
  Administered 2017-02-10: 2 g via INTRAVENOUS
  Filled 2017-02-10: qty 100

## 2017-02-10 MED ORDER — METHOCARBAMOL 500 MG PO TABS
500.0000 mg | ORAL_TABLET | Freq: Four times a day (QID) | ORAL | Status: DC | PRN
  Administered 2017-02-10: 500 mg via ORAL
  Filled 2017-02-10: qty 1

## 2017-02-10 MED ORDER — THROMBIN 20000 UNITS EX SOLR
CUTANEOUS | Status: AC
  Filled 2017-02-10: qty 20000

## 2017-02-10 MED ORDER — ACETAMINOPHEN 650 MG RE SUPP: 650.0000 mg | RECTAL | Status: DC | PRN

## 2017-02-10 MED ORDER — MENTHOL 3 MG MT LOZG: 1.0000 | LOZENGE | OROMUCOSAL | Status: DC | PRN

## 2017-02-10 MED ORDER — ONDANSETRON HCL 4 MG/2ML IJ SOLN
4.0000 mg | Freq: Four times a day (QID) | INTRAMUSCULAR | Status: DC | PRN
  Administered 2017-02-11: 4 mg via INTRAVENOUS
  Filled 2017-02-10: qty 2

## 2017-02-10 MED ORDER — OXYCODONE HCL 5 MG PO TABS
5.0000 mg | ORAL_TABLET | ORAL | Status: DC | PRN
  Administered 2017-02-10 – 2017-02-11 (×2): 10 mg via ORAL
  Filled 2017-02-10 (×2): qty 2

## 2017-02-10 MED ORDER — GELATIN ABSORBABLE MT POWD
OROMUCOSAL | Status: DC | PRN
  Administered 2017-02-10: 5 mL via TOPICAL

## 2017-02-10 MED ORDER — BUPIVACAINE HCL (PF) 0.5 % IJ SOLN
INTRAMUSCULAR | Status: DC | PRN
  Administered 2017-02-10: 6 mL

## 2017-02-10 MED ORDER — FENTANYL CITRATE (PF) 250 MCG/5ML IJ SOLN
INTRAMUSCULAR | Status: AC
  Filled 2017-02-10: qty 5

## 2017-02-10 MED ORDER — PROMETHAZINE HCL 25 MG/ML IJ SOLN
6.2500 mg | INTRAMUSCULAR | Status: DC | PRN
  Administered 2017-02-10: 6.25 mg via INTRAVENOUS

## 2017-02-10 MED ORDER — MORPHINE SULFATE (PF) 4 MG/ML IV SOLN
2.0000 mg | INTRAVENOUS | Status: DC | PRN
  Administered 2017-02-10 – 2017-02-11 (×2): 2 mg via INTRAVENOUS
  Filled 2017-02-10 (×2): qty 1

## 2017-02-10 MED ORDER — PHENOL 1.4 % MT LIQD: 1.0000 | OROMUCOSAL | Status: DC | PRN

## 2017-02-10 MED ORDER — MIDAZOLAM HCL 5 MG/5ML IJ SOLN
INTRAMUSCULAR | Status: DC | PRN
  Administered 2017-02-10: 2 mg via INTRAVENOUS

## 2017-02-10 MED ORDER — SODIUM CHLORIDE 0.9% FLUSH: 3.0000 mL | INTRAVENOUS | Status: DC | PRN

## 2017-02-10 MED ORDER — SUCCINYLCHOLINE CHLORIDE 200 MG/10ML IV SOSY
PREFILLED_SYRINGE | INTRAVENOUS | Status: AC
  Filled 2017-02-10: qty 10

## 2017-02-10 MED ORDER — HEMOSTATIC AGENTS (NO CHARGE) OPTIME
TOPICAL | Status: DC | PRN
  Administered 2017-02-10: 1 via TOPICAL

## 2017-02-10 MED ORDER — BUPIVACAINE HCL (PF) 0.5 % IJ SOLN
INTRAMUSCULAR | Status: AC
  Filled 2017-02-10: qty 30

## 2017-02-10 MED ORDER — FENTANYL CITRATE (PF) 100 MCG/2ML IJ SOLN
INTRAMUSCULAR | Status: DC | PRN
Start: 2017-02-10 — End: 2017-02-10
  Administered 2017-02-10: 50 ug via INTRAVENOUS
  Administered 2017-02-10: 100 ug via INTRAVENOUS

## 2017-02-10 MED ORDER — MELOXICAM 7.5 MG PO TABS
7.5000 mg | ORAL_TABLET | Freq: Two times a day (BID) | ORAL | Status: DC
  Administered 2017-02-10 – 2017-02-11 (×2): 7.5 mg via ORAL
  Filled 2017-02-10 (×2): qty 1

## 2017-02-10 MED ORDER — SUGAMMADEX SODIUM 200 MG/2ML IV SOLN
INTRAVENOUS | Status: AC
  Filled 2017-02-10: qty 2

## 2017-02-10 MED ORDER — PROPOFOL 10 MG/ML IV BOLUS
INTRAVENOUS | Status: DC | PRN
  Administered 2017-02-10: 180 mg via INTRAVENOUS

## 2017-02-10 MED ORDER — SUGAMMADEX SODIUM 200 MG/2ML IV SOLN
INTRAVENOUS | Status: DC | PRN
  Administered 2017-02-10: 150 mg via INTRAVENOUS

## 2017-02-10 MED ORDER — ROCURONIUM BROMIDE 10 MG/ML (PF) SYRINGE
PREFILLED_SYRINGE | INTRAVENOUS | Status: AC
  Filled 2017-02-10: qty 5

## 2017-02-10 MED ORDER — METHOCARBAMOL 1000 MG/10ML IJ SOLN
500.0000 mg | Freq: Four times a day (QID) | INTRAVENOUS | Status: DC | PRN
  Filled 2017-02-10: qty 5

## 2017-02-10 MED ORDER — PROMETHAZINE HCL 25 MG/ML IJ SOLN
INTRAMUSCULAR | Status: AC
  Filled 2017-02-10: qty 1

## 2017-02-10 MED ORDER — ONDANSETRON HCL 4 MG PO TABS: 4.0000 mg | ORAL_TABLET | Freq: Four times a day (QID) | ORAL | Status: DC | PRN

## 2017-02-10 MED ORDER — DEXAMETHASONE SODIUM PHOSPHATE 10 MG/ML IJ SOLN
10.0000 mg | INTRAMUSCULAR | Status: AC
Start: 2017-02-10 — End: 2017-02-10
  Administered 2017-02-10: 10 mg via INTRAVENOUS
  Filled 2017-02-10: qty 1

## 2017-02-10 MED ORDER — LIDOCAINE HCL (CARDIAC) 20 MG/ML IV SOLN
INTRAVENOUS | Status: DC | PRN
  Administered 2017-02-10: 40 mg via INTRAVENOUS

## 2017-02-10 MED ORDER — PROPOFOL 10 MG/ML IV BOLUS
INTRAVENOUS | Status: AC
  Filled 2017-02-10: qty 20

## 2017-02-10 MED ORDER — SODIUM CHLORIDE 0.9 % IR SOLN
Status: DC | PRN
  Administered 2017-02-10: 500 mL

## 2017-02-10 MED ORDER — DEXTROSE 5 % IV SOLN
INTRAVENOUS | Status: DC | PRN
  Administered 2017-02-10: 25 ug/min via INTRAVENOUS

## 2017-02-10 MED ORDER — SODIUM CHLORIDE 0.9% FLUSH: 3.0000 mL | Freq: Two times a day (BID) | INTRAVENOUS | Status: DC

## 2017-02-10 MED ORDER — ARTIFICIAL TEARS OPHTHALMIC OINT
TOPICAL_OINTMENT | OPHTHALMIC | Status: DC | PRN
  Administered 2017-02-10: 1 via OPHTHALMIC

## 2017-02-10 MED ORDER — HYDROMORPHONE HCL 1 MG/ML IJ SOLN
0.2500 mg | INTRAMUSCULAR | Status: DC | PRN
  Administered 2017-02-10 (×2): 0.5 mg via INTRAVENOUS

## 2017-02-10 MED ORDER — CEFAZOLIN SODIUM-DEXTROSE 2-4 GM/100ML-% IV SOLN
2.0000 g | Freq: Three times a day (TID) | INTRAVENOUS | Status: AC
  Administered 2017-02-10 – 2017-02-11 (×2): 2 g via INTRAVENOUS
  Filled 2017-02-10 (×2): qty 100

## 2017-02-10 SURGICAL SUPPLY — 50 items
ADH SKN CLS APL DERMABOND .7 (GAUZE/BANDAGES/DRESSINGS) ×1
APL SKNCLS STERI-STRIP NONHPOA (GAUZE/BANDAGES/DRESSINGS) ×1
BAG DECANTER FOR FLEXI CONT (MISCELLANEOUS) ×3 IMPLANT
BENZOIN TINCTURE PRP APPL 2/3 (GAUZE/BANDAGES/DRESSINGS) ×3 IMPLANT
BUR MATCHSTICK NEURO 3.0 LAGG (BURR) ×3 IMPLANT
CANISTER SUCT 3000ML PPV (MISCELLANEOUS) ×3 IMPLANT
CARTRIDGE OIL MAESTRO DRILL (MISCELLANEOUS) ×1 IMPLANT
CLOSURE WOUND 1/2 X4 (GAUZE/BANDAGES/DRESSINGS) ×1
DERMABOND ADVANCED (GAUZE/BANDAGES/DRESSINGS) ×2
DERMABOND ADVANCED .7 DNX12 (GAUZE/BANDAGES/DRESSINGS) IMPLANT
DIFFUSER DRILL AIR PNEUMATIC (MISCELLANEOUS) ×3 IMPLANT
DRAPE LAPAROTOMY 100X72X124 (DRAPES) ×3 IMPLANT
DRAPE MICROSCOPE LEICA (MISCELLANEOUS) ×3 IMPLANT
DRAPE POUCH INSTRU U-SHP 10X18 (DRAPES) ×3 IMPLANT
DRAPE SURG 17X23 STRL (DRAPES) ×3 IMPLANT
DRSG OPSITE POSTOP 3X4 (GAUZE/BANDAGES/DRESSINGS) ×2 IMPLANT
DURAPREP 26ML APPLICATOR (WOUND CARE) ×3 IMPLANT
ELECT REM PT RETURN 9FT ADLT (ELECTROSURGICAL) ×3
ELECTRODE REM PT RTRN 9FT ADLT (ELECTROSURGICAL) ×1 IMPLANT
GAUZE SPONGE 4X4 16PLY XRAY LF (GAUZE/BANDAGES/DRESSINGS) IMPLANT
GLOVE BIO SURGEON STRL SZ7 (GLOVE) ×2 IMPLANT
GLOVE BIO SURGEON STRL SZ8 (GLOVE) ×3 IMPLANT
GLOVE BIOGEL PI IND STRL 7.0 (GLOVE) IMPLANT
GLOVE BIOGEL PI INDICATOR 7.0 (GLOVE) ×2
GOWN STRL REUS W/ TWL LRG LVL3 (GOWN DISPOSABLE) IMPLANT
GOWN STRL REUS W/ TWL XL LVL3 (GOWN DISPOSABLE) ×1 IMPLANT
GOWN STRL REUS W/TWL 2XL LVL3 (GOWN DISPOSABLE) IMPLANT
GOWN STRL REUS W/TWL LRG LVL3 (GOWN DISPOSABLE) ×6
GOWN STRL REUS W/TWL XL LVL3 (GOWN DISPOSABLE) ×3
HEMOSTAT POWDER KIT SURGIFOAM (HEMOSTASIS) ×2 IMPLANT
KIT BASIN OR (CUSTOM PROCEDURE TRAY) ×3 IMPLANT
KIT ROOM TURNOVER OR (KITS) ×3 IMPLANT
NDL HYPO 25X1 1.5 SAFETY (NEEDLE) ×1 IMPLANT
NDL SPNL 20GX3.5 QUINCKE YW (NEEDLE) IMPLANT
NEEDLE HYPO 25X1 1.5 SAFETY (NEEDLE) ×3 IMPLANT
NEEDLE SPNL 20GX3.5 QUINCKE YW (NEEDLE) IMPLANT
NS IRRIG 1000ML POUR BTL (IV SOLUTION) ×3 IMPLANT
OIL CARTRIDGE MAESTRO DRILL (MISCELLANEOUS) ×3
PACK LAMINECTOMY NEURO (CUSTOM PROCEDURE TRAY) ×3 IMPLANT
PAD ARMBOARD 7.5X6 YLW CONV (MISCELLANEOUS) ×9 IMPLANT
RUBBERBAND STERILE (MISCELLANEOUS) ×6 IMPLANT
SPONGE SURGIFOAM ABS GEL SZ50 (HEMOSTASIS) ×3 IMPLANT
STRIP CLOSURE SKIN 1/2X4 (GAUZE/BANDAGES/DRESSINGS) ×2 IMPLANT
SUT VIC AB 0 CT1 18XCR BRD8 (SUTURE) ×1 IMPLANT
SUT VIC AB 0 CT1 8-18 (SUTURE) ×3
SUT VIC AB 2-0 CP2 18 (SUTURE) ×3 IMPLANT
SUT VIC AB 3-0 SH 8-18 (SUTURE) ×3 IMPLANT
TOWEL GREEN STERILE (TOWEL DISPOSABLE) ×3 IMPLANT
TOWEL GREEN STERILE FF (TOWEL DISPOSABLE) ×3 IMPLANT
WATER STERILE IRR 1000ML POUR (IV SOLUTION) ×3 IMPLANT

## 2017-02-10 NOTE — Transfer of Care (Signed)
Immediate Anesthesia Transfer of Care Note  Patient: Rusty AusConnie S Bragdon  Procedure(s) Performed: Procedure(s): Microdiscectomy - Lumbar four-five, Lumbar five-Sacrum one  - right (Right)  Patient Location: PACU  Anesthesia Type:General  Level of Consciousness: awake, alert , oriented and patient cooperative  Airway & Oxygen Therapy: Patient Spontanous Breathing and Patient connected to nasal cannula oxygen  Post-op Assessment: Report given to RN, Post -op Vital signs reviewed and stable, Patient moving all extremities and Patient moving all extremities X 4  Post vital signs: Reviewed and stable  Last Vitals:  Vitals:   02/10/17 1035  BP: 138/86  Pulse: 74  Resp: 20  Temp: 36.8 C  SpO2: 98%    Last Pain:  Vitals:   02/10/17 1035  TempSrc: Oral         Complications: No apparent anesthesia complications

## 2017-02-10 NOTE — Op Note (Signed)
02/10/2017  2:31 PM  PATIENT:  Claudia Chapman  46 y.o. female  PRE-OPERATIVE DIAGNOSIS:  Lumbar spinal stenosis L4-5 L5-S1 with lumbar disc herniation with back and right leg pain  POST-OPERATIVE DIAGNOSIS:  same  PROCEDURE:  Decompressive lumbar hemilaminectomy and medial facetectomy and foraminotomy L4-5 L5-S1 right with microdiscectomy at both levels utilizing microscopic dissection  SURGEON:  Marikay Alaravid Jael Kostick, MD  ASSISTANTS: Lovell SheehanJenkins M.D.  ANESTHESIA:   General  EBL: 40 ml  Total I/O In: 1000 [I.V.:1000] Out: 40 [Blood:40]  BLOOD ADMINISTERED: none  DRAINS: None  SPECIMEN:  none  INDICATION FOR PROCEDURE: This patient presented with back and right leg pain. Imaging showed herniated disc at L4-5. The right with spinal stenosis. The patient tried conservative measures without relief. Pain was debilitating. Recommended right L4-5 and L5-S1 decompression and discectomy. Patient understood the risks, benefits, and alternatives and potential outcomes and wished to proceed.  PROCEDURE DETAILS: The patient was taken to the operating room and after induction of adequate generalized endotracheal anesthesia, the patient was rolled into the prone position on the Wilson frame and all pressure points were padded. The lumbar region was cleaned and then prepped with DuraPrep and draped in the usual sterile fashion. 5 cc of local anesthesia was injected and then a dorsal midline incision was made and carried down to the lumbo sacral fascia. The fascia was opened and the paraspinous musculature was taken down in a subperiosteal fashion to expose L4-5 and L5-S1 on the right. Intraoperative x-ray confirmed my level, and then I used a combination of the high-speed drill and the Kerrison punches to perform a hemilaminectomy, medial facetectomy, and foraminotomy at L4-5 and L5-S1 on the right. The underlying yellow ligament was opened and removed in a piecemeal fashion to expose the underlying dura and  exiting nerve root. I undercut the lateral recess and dissected down until I was medial to and distal to the pedicle. The nerve root was well decompressed. We then gently retracted the nerve root medially with a retractor, coagulated the epidural venous vasculature, and incised the disc space. I performed a thorough intradiscal discectomy with pituitary rongeurs at L5-S1 on the right, until I had a nice decompression of the nerve root and the midline. At L4-5 it did not find a significant disc herniation though there was the remnant of a small inferior free fragment. I then palpated with a coronary dilator along the nerve root and into the foramen to assure adequate decompression. I felt no more compression of the nerve root. I irrigated with saline solution containing bacitracin. Achieved hemostasis with bipolar cautery, lined the dura with Gelfoam, and then closed the fascia with 0 Vicryl. I closed the subcutaneous tissues with 2-0 Vicryl and the subcuticular tissues with 3-0 Vicryl. The skin was then closed with benzoin and Steri-Strips. The drapes were removed, a sterile dressing was applied. The patient was awakened from general anesthesia and transferred to the recovery room in stable condition. At the end of the procedure all sponge, needle and instrument counts were correct.    PLAN OF CARE: Admit for overnight observation  PATIENT DISPOSITION:  PACU - hemodynamically stable.   Delay start of Pharmacological VTE agent (>24hrs) due to surgical blood loss or risk of bleeding:  yes

## 2017-02-10 NOTE — Anesthesia Postprocedure Evaluation (Signed)
Anesthesia Post Note  Patient: Claudia Chapman  Procedure(s) Performed: Procedure(s) (LRB): Microdiscectomy - Lumbar four-five, Lumbar five-Sacrum one  - right (Right)     Patient location during evaluation: PACU Anesthesia Type: General Level of consciousness: awake and sedated Pain management: pain level controlled Vital Signs Assessment: post-procedure vital signs reviewed and stable Respiratory status: spontaneous breathing, nonlabored ventilation, respiratory function stable and patient connected to nasal cannula oxygen Cardiovascular status: blood pressure returned to baseline and stable Postop Assessment: no signs of nausea or vomiting Anesthetic complications: no    Last Vitals:  Vitals:   02/10/17 1457 02/10/17 1512  BP: 125/75 123/67  Pulse: 80 76  Resp: 16 19  Temp:    SpO2: 99% 98%    Last Pain:  Vitals:   02/10/17 1537  TempSrc:   PainSc: 10-Worst pain ever                 Nathasha Fiorillo,JAMES TERRILL

## 2017-02-10 NOTE — Anesthesia Procedure Notes (Signed)
Procedure Name: Intubation Date/Time: 02/10/2017 1:02 PM Performed by: Coralee RudFLORES, Ireta Pullman Pre-anesthesia Checklist: Patient identified, Emergency Drugs available, Suction available and Patient being monitored Patient Re-evaluated:Patient Re-evaluated prior to induction Oxygen Delivery Method: Circle system utilized Preoxygenation: Pre-oxygenation with 100% oxygen Induction Type: IV induction Ventilation: Mask ventilation without difficulty Laryngoscope Size: Miller and 3 Grade View: Grade II Tube type: Oral Tube size: 7.5 mm Number of attempts: 1 Airway Equipment and Method: Stylet Placement Confirmation: ETT inserted through vocal cords under direct vision,  positive ETCO2 and breath sounds checked- equal and bilateral Secured at: 22 cm Tube secured with: Tape Dental Injury: Teeth and Oropharynx as per pre-operative assessment
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
## 2017-02-10 NOTE — Anesthesia Preprocedure Evaluation (Signed)
Anesthesia Evaluation  Patient identified by MRN, date of birth, ID band Patient awake    Reviewed: Allergy & Precautions, NPO status , Patient's Chart, lab work & pertinent test results  History of Anesthesia Complications (+) PONV and history of anesthetic complications  Airway Mallampati: II   Neck ROM: Full    Dental no notable dental hx.    Pulmonary neg pulmonary ROS,    breath sounds clear to auscultation       Cardiovascular hypertension,  Rhythm:Regular Rate:Normal     Neuro/Psych    GI/Hepatic Neg liver ROS, GERD  ,  Endo/Other  Morbid obesity  Renal/GU negative Renal ROS     Musculoskeletal   Abdominal (+) + obese,   Peds  Hematology negative hematology ROS (+)   Anesthesia Other Findings   Reproductive/Obstetrics                             Anesthesia Physical Anesthesia Plan  ASA: II  Anesthesia Plan: General   Post-op Pain Management:    Induction: Intravenous  PONV Risk Score and Plan: 4 or greater and Ondansetron, Dexamethasone, Midazolam, Scopolamine patch - Pre-op and Treatment may vary due to age or medical condition  Airway Management Planned: Oral ETT  Additional Equipment:   Intra-op Plan:   Post-operative Plan: Extubation in OR  Informed Consent: I have reviewed the patients History and Physical, chart, labs and discussed the procedure including the risks, benefits and alternatives for the proposed anesthesia with the patient or authorized representative who has indicated his/her understanding and acceptance.   Dental advisory given  Plan Discussed with: CRNA  Anesthesia Plan Comments:         Anesthesia Quick Evaluation

## 2017-02-11 ENCOUNTER — Encounter (HOSPITAL_COMMUNITY): Payer: Self-pay | Admitting: Neurological Surgery

## 2017-02-11 DIAGNOSIS — M48061 Spinal stenosis, lumbar region without neurogenic claudication: Secondary | ICD-10-CM | POA: Diagnosis not present

## 2017-02-11 MED ORDER — OXYCODONE HCL 5 MG PO TABS: 5.0000 mg | ORAL_TABLET | ORAL | 0 refills | Status: DC | PRN

## 2017-02-11 MED ORDER — MELOXICAM 7.5 MG PO TABS: 7.5000 mg | ORAL_TABLET | Freq: Two times a day (BID) | ORAL | 0 refills | Status: DC

## 2017-02-11 MED ORDER — PROMETHAZINE HCL 25 MG PO TABS
25.0000 mg | ORAL_TABLET | Freq: Once | ORAL | Status: AC
  Administered 2017-02-11: 25 mg via ORAL
  Filled 2017-02-11: qty 1

## 2017-02-11 NOTE — Progress Notes (Signed)
Patient alert and oriented, mae's well, voiding adequate amount of urine, swallowing without difficulty, c/o pain at time of discharge. Patient discharged home with family. Script and discharged instructions given to patient. Patient and family stated understanding of instructions given. Patient has an appointment with Dr. Jones  

## 2017-02-11 NOTE — Discharge Summary (Signed)
Physician Discharge Summary  Patient ID: Claudia Chapman MRN: [id number] DOB/AGE: September 13, 1970 46 y.o.  Admit date: 02/10/2017 Discharge date: 02/11/2017  Admission Diagnoses:L4-5 and L5-S1 spinal stenosis, lumbago, lumbar radiculopathy  Discharge Diagnoses: The same Active Problems:   S/P lumbar laminectomy   Discharged Condition: good  Hospital Course: Dr. Yetta BarreJones performed at L4-5 and L5-S1 laminectomy on the patient on 02/10/2017.  The patient's postoperative course was unremarkable. On postoperative day #1 she requested discharge home. The patient was given oral and written discharge instructions. All her questions were answered.  Consults: None Significant Diagnostic Studies: None Treatments: L4-5 and L5-S1 laminectomy Discharge Exam: Blood pressure 113/79, pulse 71, temperature 97.7 F (36.5 C), resp. rate 16, height 5\' 1"  (1.549 m), weight 91.2 kg (201 lb), last menstrual period 02/09/2017, SpO2 99 %. The patient is alert and pleasant. Her dressing is clean and dry. Her strength is grossly normal to lower extremities.  Disposition: Home      Signed: Cristi LoronJENKINS,Coston Mandato D 02/11/2017, 10:54 AM

## 2017-02-11 NOTE — Care Management Note (Signed)
Case Management Note  Patient Details  Name: Claudia Chapman MRN: [id number] Date of Birth: 1970-08-27  Subjective/Objective:                 Patient with order to DC to home today. Chart reviewed. No Home Health or Equipment needs, no unacknowledged Case Management consults or medication needs identified at the time of this note. Plan for DC to home. If needs arise today prior to discharge, please call Lawerance SabalDebbie Adalyne Lovick RN CM at [phone].    Action/Plan:   Expected Discharge Date:  02/11/17               Expected Discharge Plan:  Home/Self Care  In-House Referral:     Discharge planning Services  CM Consult  Post Acute Care Choice:    Choice offered to:     DME Arranged:    DME Agency:     HH Arranged:    HH Agency:     Status of Service:  Completed, signed off  If discussed at [employer] Length of Stay Meetings, dates discussed:    Additional Comments:  Lawerance SabalDebbie Janae Bonser, RN 02/11/2017, 11:09 AM

## 2017-02-14 MED FILL — Thrombin For Soln 20000 Unit: CUTANEOUS | Qty: 1 | Status: AC

## 2017-08-28 ENCOUNTER — Other Ambulatory Visit: Payer: Self-pay | Admitting: Neurological Surgery

## 2017-08-28 DIAGNOSIS — M545 Low back pain: Secondary | ICD-10-CM

## 2017-09-09 ENCOUNTER — Other Ambulatory Visit: Payer: BLUE CROSS/BLUE SHIELD

## 2017-09-14 ENCOUNTER — Other Ambulatory Visit: Payer: Self-pay

## 2017-10-09 DIAGNOSIS — D72829 Elevated white blood cell count, unspecified: Secondary | ICD-10-CM

## 2018-11-23 IMAGING — CR DG CHEST 2V
2 series · 2 of 2 positions shown · non-contrast
Comparison: 06/22/2016

CLINICAL DATA: Preop lumbar surgery

EXAM:
CHEST  2 VIEW

[w chest pa]
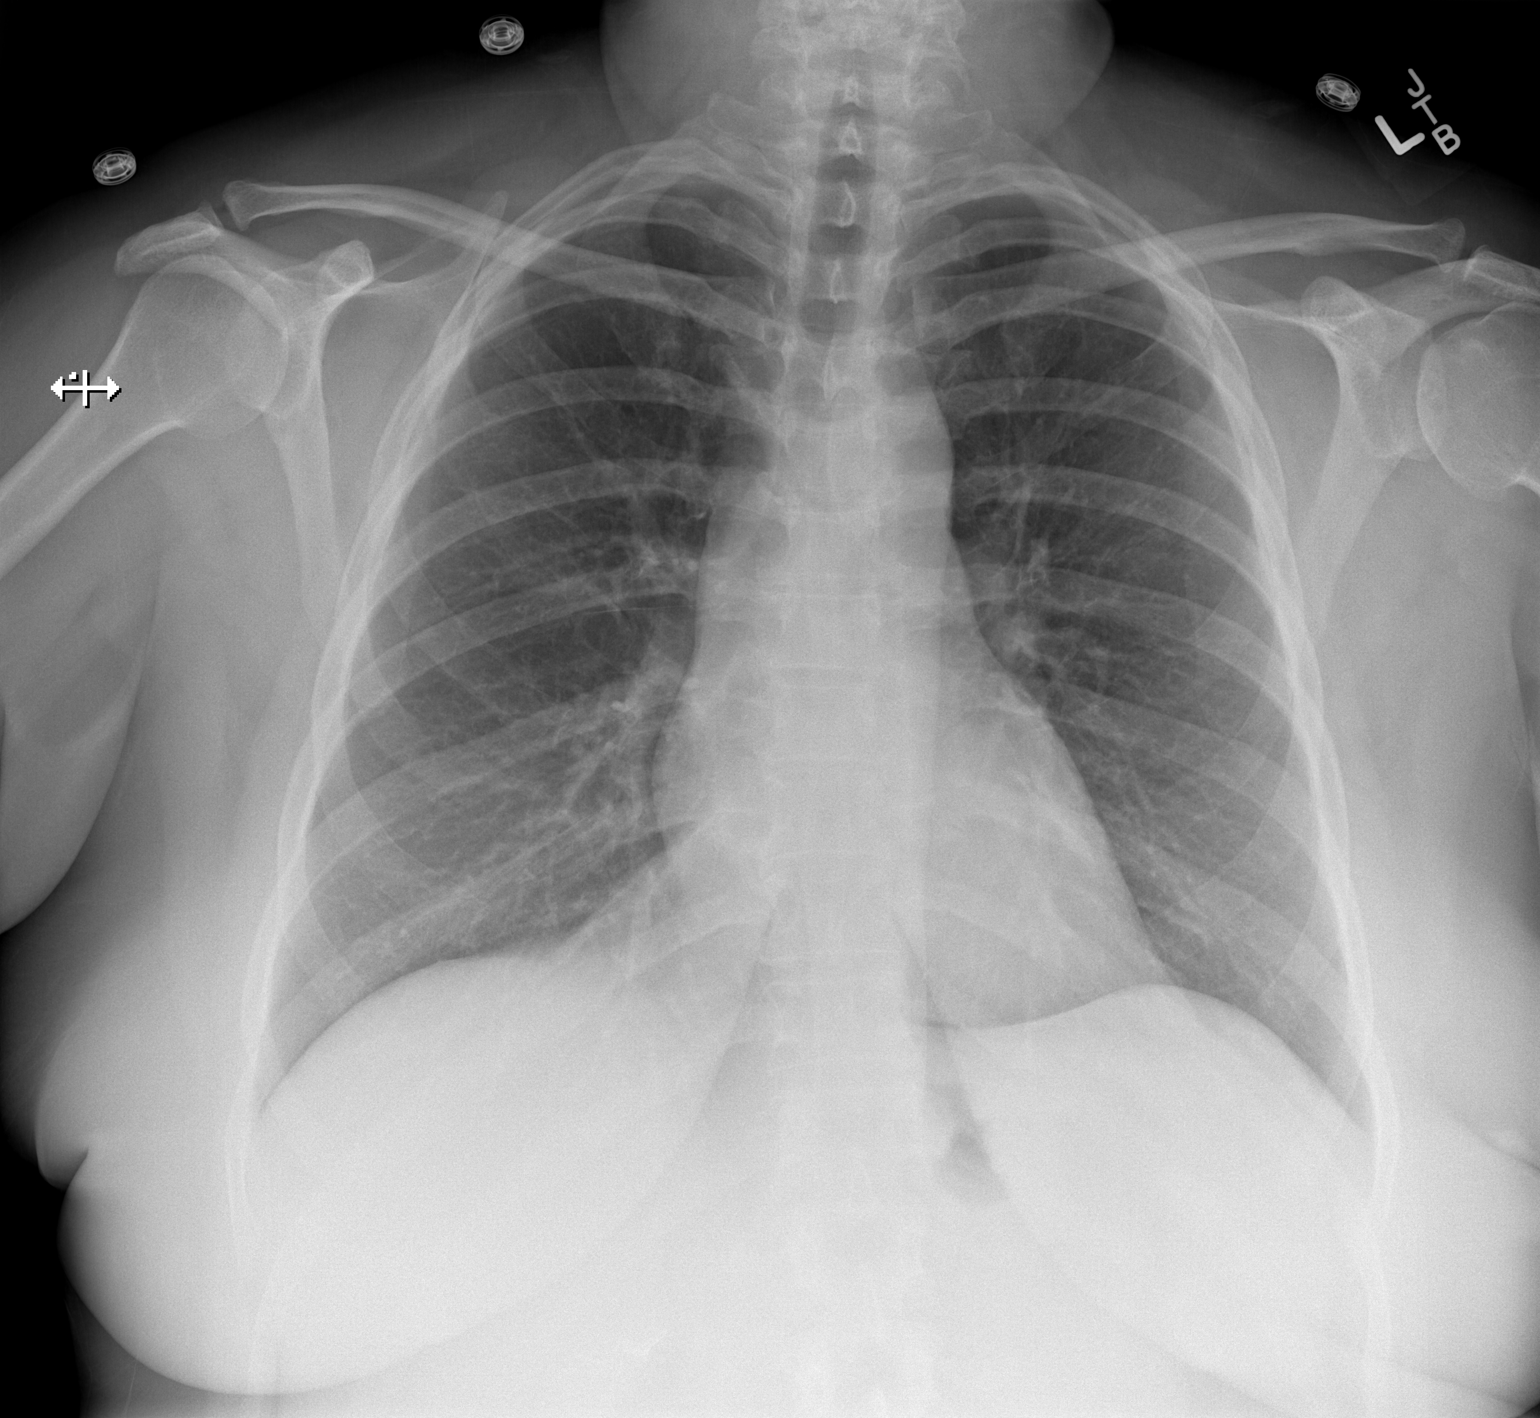

[w chest lat]
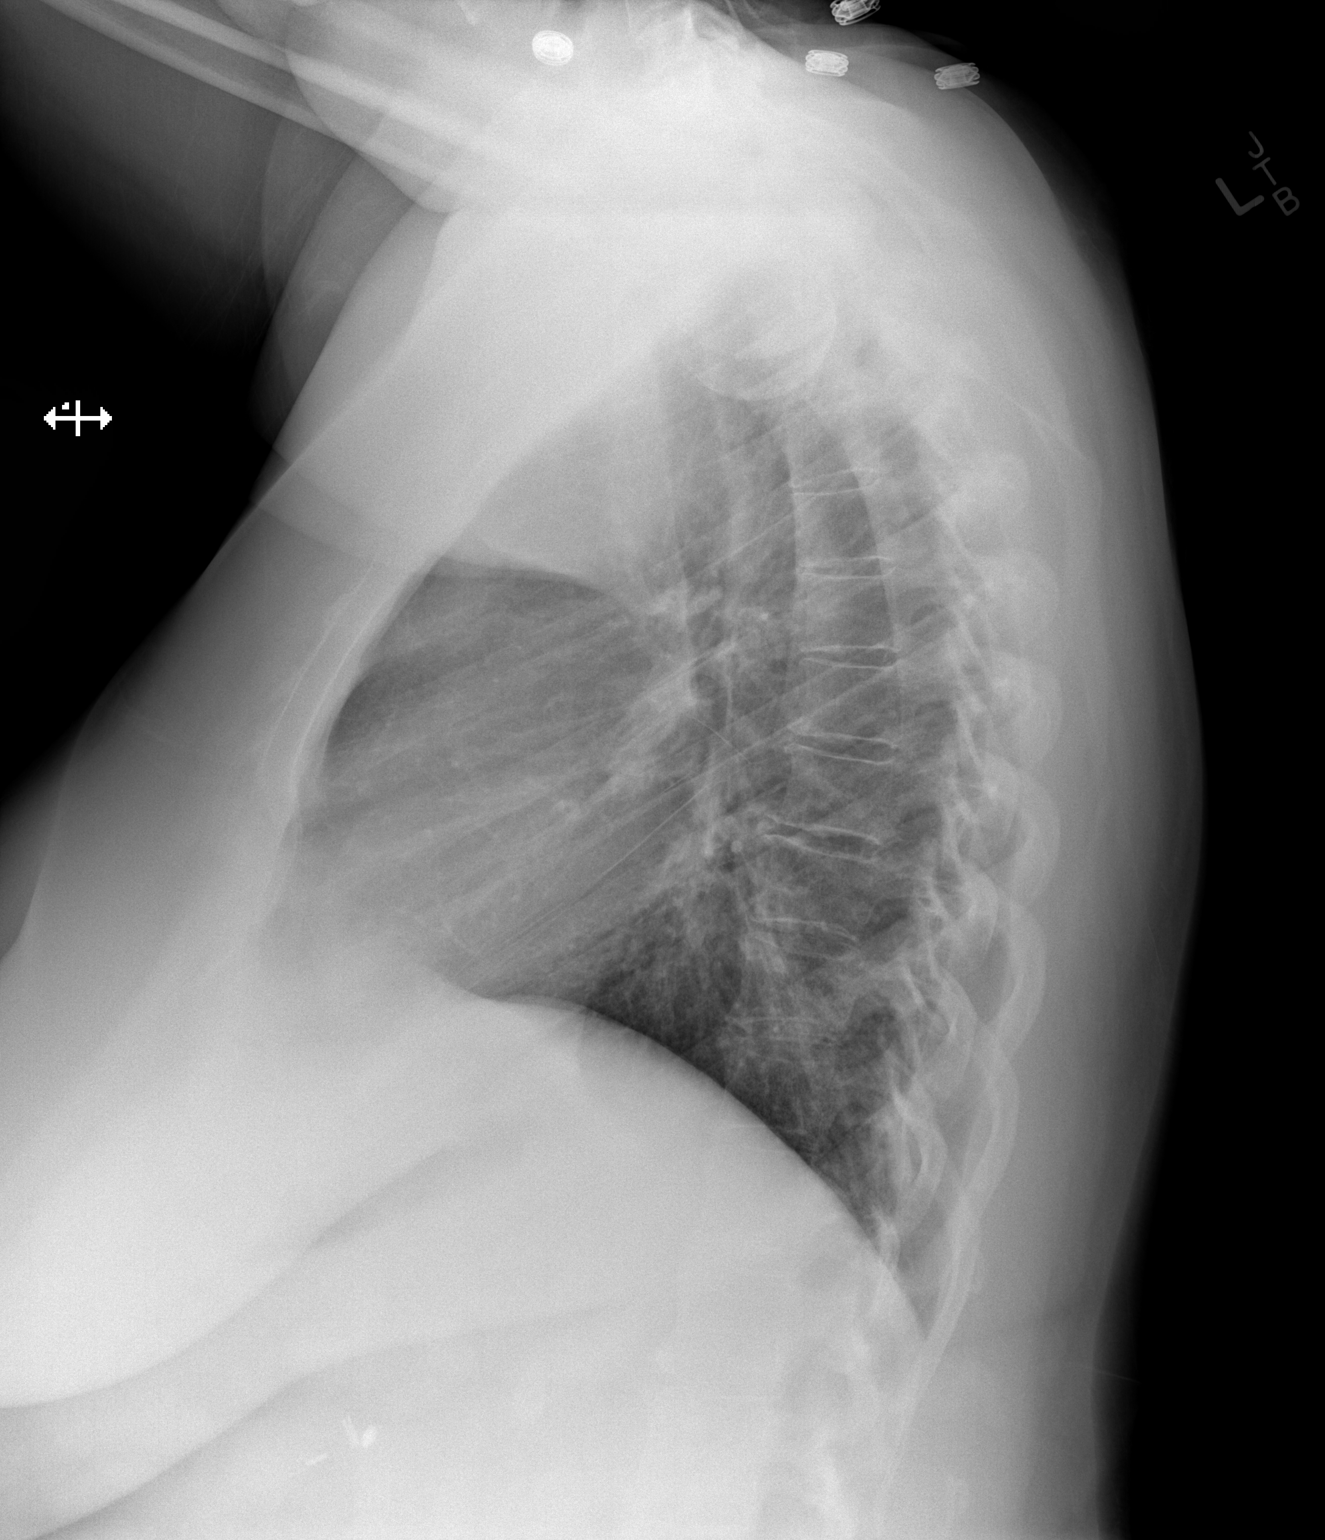

[2 of 2 positions shown; findings below may reference images not displayed]

FINDINGS: Heart and mediastinal contours are within normal limits. No focal
opacities or effusions. No acute bony abnormality.
IMPRESSION: No active cardiopulmonary disease.

## 2019-03-03 ENCOUNTER — Other Ambulatory Visit: Payer: Self-pay

## 2019-03-03 ENCOUNTER — Emergency Department (HOSPITAL_COMMUNITY)
Admission: EM | Admit: 2019-03-03 | Discharge: 2019-03-03 | Disposition: A | Discharge status: Home / Self Care | Payer: Self-pay | Attending: Emergency Medicine | Admitting: Emergency Medicine

## 2019-03-03 DIAGNOSIS — R1031 Right lower quadrant pain: Secondary | ICD-10-CM | POA: Insufficient documentation

## 2019-03-03 DIAGNOSIS — Z5321 Procedure and treatment not carried out due to patient leaving prior to being seen by health care provider: Secondary | ICD-10-CM | POA: Insufficient documentation

## 2019-03-03 LAB — CBC
HCT: 37.6 % (ref 36.0–46.0)
Hemoglobin: 12.2 g/dL (ref 12.0–15.0)
MCH: 25.4 pg — ABNORMAL LOW (ref 26.0–34.0)
MCHC: 32.4 g/dL (ref 30.0–36.0)
MCV: 78.3 fL — ABNORMAL LOW (ref 80.0–100.0)
Platelets: 353 10*3/uL (ref 150–400)
RBC: 4.8 MIL/uL (ref 3.87–5.11)
RDW: 15.3 % (ref 11.5–15.5)
WBC: 15.9 10*3/uL — ABNORMAL HIGH (ref 4.0–10.5)
nRBC: 0 % (ref 0.0–0.2)

## 2019-03-03 LAB — LIPASE, BLOOD: Lipase: 30 U/L (ref 11–51)

## 2019-03-03 LAB — COMPREHENSIVE METABOLIC PANEL
ALT: 17 U/L (ref 0–44)
AST: 18 U/L (ref 15–41)
Albumin: 3.7 g/dL (ref 3.5–5.0)
Alkaline Phosphatase: 74 U/L (ref 38–126)
Anion gap: 14 (ref 5–15)
BUN: 8 mg/dL (ref 6–20)
CO2: 21 mmol/L — ABNORMAL LOW (ref 22–32)
Calcium: 8.8 mg/dL — ABNORMAL LOW (ref 8.9–10.3)
Chloride: 102 mmol/L (ref 98–111)
Creatinine, Ser: 0.91 mg/dL (ref 0.44–1.00)
GFR calc Af Amer: >60 mL/min (ref >60)
GFR calc non Af Amer: >60 mL/min (ref >60)
Glucose, Bld: 142 mg/dL — ABNORMAL HIGH (ref 70–99)
Potassium: 3.8 mmol/L (ref 3.5–5.1)
Sodium: 137 mmol/L (ref 135–145)
Total Bilirubin: 0.7 mg/dL (ref 0.3–1.2)
Total Protein: 7.1 g/dL (ref 6.5–8.1)

## 2019-03-03 LAB — I-STAT BETA HCG BLOOD, ED (MC, WL, AP ONLY): I-stat hCG, quantitative: <5 m[IU]/mL (ref <5)

## 2019-03-03 MED ORDER — SODIUM CHLORIDE 0.9% FLUSH: 3.0000 mL | Freq: Once | INTRAVENOUS | Status: DC

## 2019-03-03 NOTE — ED Triage Notes (Signed)
Pt c/o RLQ pain and back pain. Back pain started x 2 days ago. Hx endometriosis, denies nausea/vomiting, c/o diarrhea.

## 2019-03-03 NOTE — ED Notes (Signed)
Pt left. 

## 2023-11-10 ENCOUNTER — Ambulatory Visit (HOSPITAL_COMMUNITY): Admission: RE | Admit: 2023-11-10 | Source: Home / Self Care | Admitting: Oral Surgery

## 2023-11-10 ENCOUNTER — Encounter (HOSPITAL_COMMUNITY): Admission: RE | Payer: Self-pay | Source: Home / Self Care

## 2023-11-10 SURGERY — DENTAL RESTORATION/EXTRACTIONS
Anesthesia: General

## 2024-01-08 NOTE — H&P (Signed)
  Patient: Claudia Chapman  PID: [id number]  DOB: 18-Apr-1971  SEX: Female   Patient referred by DDS for extraction all remaining teeth  CC: Loose teeth.  Past Medical History:  High Blood Pressure, Snoring, Sleep Apnea, High Cholesterol, Mental Health problems, Chronic Fatigue Syndrome, Night Sweats, Obese    Medications: Alprazolam , Venlafaxine, Buspirone, Triamterene -HCTZ, Amoxicillin, Vitamin D2    Allergies:     Sulfa    Surgeries:   back surgery, gallbladder removal, Oral Surgery     Social History       Smoking:            Alcohol: Drug use:                             Exam: BMI 34. 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 Teeth # 1 - 23, 27- 32. No purulence, edema, fluctuance, trismus. Oral cancer screening negative. Pharynx clear. No lymphadenopathy.  Panorex: Severe periodontally compromised teeth with gingival inflammation, mobility, calculus. Teeth # 1 - 23, 26- 32.   Assessment: ASA 2. Non-restorable  teeth # 1, 2, 3, 4, 5, 6, 7, 8, 9, 10, 11, 12, 13, 14, 15, 16, 17, 18, 19, 20, 21, 22, 23, 27, 28, 29, 30, 31, 32.              Plan: Extraction Teeth # 1, 2, 3, 4, 5, 6, 7, 8, 9, 10, 11, 12, 13, 14, 15, 16, 17, 18, 19, 20, 21, 22, 23, 27, 28, 29, 30, 31, 32. Alveoloplasty.         Hospital Day surgery.                 Rx: n               Risks and complications explained. Questions answered.   Glendia EMERSON Primrose, DMD

## 2024-01-10 NOTE — Progress Notes (Signed)
 Pt states that she has cancelled surgery for 8/1. She has already had her teeth pulled with another dentist. She asks not to be called anymore. Attempted to call Dr. Terrance office, no answer. LVM.

## 2024-01-12 ENCOUNTER — Encounter (HOSPITAL_COMMUNITY): Admission: RE | Payer: Self-pay | Source: Home / Self Care

## 2024-01-12 ENCOUNTER — Ambulatory Visit (HOSPITAL_COMMUNITY): Admission: RE | Admit: 2024-01-12 | Source: Home / Self Care | Admitting: Oral Surgery

## 2024-01-12 SURGERY — DENTAL RESTORATION/EXTRACTIONS
Anesthesia: General

## 2024-02-12 ENCOUNTER — Other Ambulatory Visit: Payer: Self-pay

## 2024-02-12 ENCOUNTER — Emergency Department (HOSPITAL_BASED_OUTPATIENT_CLINIC_OR_DEPARTMENT_OTHER)
Admission: EM | Admit: 2024-02-12 | Discharge: 2024-02-12 | Disposition: A | Discharge status: Home / Self Care | Attending: Emergency Medicine | Admitting: Emergency Medicine

## 2024-02-12 ENCOUNTER — Encounter (HOSPITAL_BASED_OUTPATIENT_CLINIC_OR_DEPARTMENT_OTHER): Payer: Self-pay

## 2024-02-12 DIAGNOSIS — Z79899 Other long term (current) drug therapy: Secondary | ICD-10-CM | POA: Insufficient documentation

## 2024-02-12 DIAGNOSIS — R0789 Other chest pain: Secondary | ICD-10-CM | POA: Insufficient documentation

## 2024-02-12 DIAGNOSIS — R200 Anesthesia of skin: Secondary | ICD-10-CM | POA: Diagnosis not present

## 2024-02-12 DIAGNOSIS — I10 Essential (primary) hypertension: Secondary | ICD-10-CM | POA: Insufficient documentation

## 2024-02-12 LAB — BASIC METABOLIC PANEL WITH GFR
Anion gap: 15 (ref 5–15)
BUN: 5 mg/dL — ABNORMAL LOW (ref 6–20)
CO2: 21 mmol/L — ABNORMAL LOW (ref 22–32)
Calcium: 9.5 mg/dL (ref 8.9–10.3)
Chloride: 99 mmol/L (ref 98–111)
Creatinine, Ser: 0.85 mg/dL (ref 0.44–1.00)
GFR, Estimated: >60 mL/min
Glucose, Bld: 145 mg/dL — ABNORMAL HIGH (ref 70–99)
Potassium: 3.9 mmol/L (ref 3.5–5.1)
Sodium: 136 mmol/L (ref 135–145)

## 2024-02-12 LAB — CBC
HCT: 39.5 % (ref 36.0–46.0)
Hemoglobin: 13.1 g/dL (ref 12.0–15.0)
MCH: 26.7 pg (ref 26.0–34.0)
MCHC: 33.2 g/dL (ref 30.0–36.0)
MCV: 80.4 fL (ref 80.0–100.0)
Platelets: 343 K/uL (ref 150–400)
RBC: 4.91 MIL/uL (ref 3.87–5.11)
RDW: 13.6 % (ref 11.5–15.5)
WBC: 15.6 K/uL — ABNORMAL HIGH (ref 4.0–10.5)
nRBC: 0 % (ref 0.0–0.2)

## 2024-02-12 LAB — TROPONIN T, HIGH SENSITIVITY: Troponin T High Sensitivity: <15 ng/L (ref 0–19)

## 2024-02-12 NOTE — ED Provider Notes (Signed)
 [place] [facility] AT [facility] Provider Note   CSN: [id number] Arrival date & time: 02/12/24  0032     Patient presents with: Chest Pain   Claudia Chapman is a 53 y.o. female.   Patient is a 53 year old female with past medical history of anxiety, GERD, hypertension.  Patient presenting today with complaints of chest discomfort.  This has been ongoing for the past week.  She describes a tightness across her upper chest that has been constant, but does wax and wane in intensity.  She feels short of breath, but denies any nausea or diaphoresis.  She does describe some numbness in her left arm.  She denies any recent exertional symptoms.  No prior cardiac history personally or within her family.       Prior to Admission medications   Medication Sig Start Date End Date Taking? Authorizing Provider  acetaminophen  (TYLENOL ) 500 MG tablet Take 500-1,000 mg by mouth every 8 (eight) hours as needed (for pain).    [provider]  ALPRAZolam  (XANAX ) 0.25 MG tablet Take 0.25 mg by mouth 2 (two) times daily as needed for anxiety.  08/23/16   [provider]  atorvastatin (LIPITOR) 20 MG tablet Take 20 mg by mouth at bedtime. 04/13/16   [provider]  ibuprofen  (ADVIL ,MOTRIN ) 200 MG tablet Take 200-800 mg by mouth 2 (two) times daily as needed (for pain).     [provider]  Melatonin 3 MG TABS Take 6 mg by mouth at bedtime.    [provider]  meloxicam  (MOBIC ) 7.5 MG tablet Take 7.5 mg by mouth 2 (two) times daily. 12/05/16   [provider]  meloxicam  (MOBIC ) 7.5 MG tablet Take 1 tablet (7.5 mg total) by mouth 2 (two) times daily. 02/11/17   Mavis Purchase, MD  Multiple Vitamin (MULTIVITAMIN WITH MINERALS) TABS tablet Take 1 tablet by mouth daily.    [provider]  oxyCODONE  (OXY IR/ROXICODONE ) 5 MG immediate release tablet Take 1-2 tablets (5-10 mg total) by mouth every 3 (three) hours as needed for  breakthrough pain. 02/11/17   Mavis Purchase, MD  traMADol  (ULTRAM ) 50 MG tablet Take 50 mg by mouth 2 (two) times daily as needed (for pain).    [provider]  triamterene -hydrochlorothiazide  (MAXZIDE -25) 37.5-25 MG tablet Take 1 tablet by mouth daily. 12/05/16   [provider]    Allergies: Bee venom, Sulfa antibiotics, and Vicodin [hydrocodone-acetaminophen ]    Review of Systems  All other systems reviewed and are negative.   Updated Vital Signs BP (!) 147/63 (BP Location: Right Arm)   Pulse (!) 40   Temp 98.3 F (36.8 C) (Oral)   Resp 18   Ht 5' 2 (1.575 m)   Wt 82.6 kg   LMP 01/13/2024 (Approximate)   SpO2 100%   BMI 33.29 kg/m   Physical Exam Vitals and nursing note reviewed.  Constitutional:      General: She is not in acute distress.    Appearance: She is well-developed. She is not diaphoretic.  HENT:     Head: Normocephalic and atraumatic.  Cardiovascular:     Rate and Rhythm: Normal rate and regular rhythm.     Heart sounds: No murmur heard.    No friction rub. No gallop.  Pulmonary:     Effort: Pulmonary effort is normal. No respiratory distress.     Breath sounds: Normal breath sounds. No wheezing.  Abdominal:     General: Bowel sounds are normal. There  is no distension.     Palpations: Abdomen is soft.     Tenderness: There is no abdominal tenderness.  Musculoskeletal:        General: Normal range of motion.     Cervical back: Normal range of motion and neck supple.     Right lower leg: No tenderness. No edema.     Left lower leg: No tenderness. No edema.  Skin:    General: Skin is warm and dry.  Neurological:     General: No focal deficit present.     Mental Status: She is alert and oriented to person, place, and time.     (all labs ordered are listed, but only abnormal results are displayed) Labs Reviewed  BASIC METABOLIC PANEL WITH GFR  CBC  TROPONIN T, HIGH SENSITIVITY    EKG: EKG Interpretation Date/Time:  Monday  February 12 2024 00:43:30 EDT Ventricular Rate:  89 PR Interval:  168 QRS Duration:  105 QT Interval:  431 QTC Calculation: 445 R Axis:   -33  Text Interpretation: Sinus rhythm Ventricular bigeminy Left axis deviation Abnormal R-wave progression, early transition Confirmed by Geroldine Berg ([id number]) on 02/12/2024 12:45:52 AM  Radiology: No results found.   Procedures   Medications Ordered in the ED - No data to display                                  Medical Decision Making Amount and/or Complexity of Data Reviewed Labs: ordered.   Patient is a 53 year old female presenting with complaints of chest discomfort.  Patient arrives here with stable vital signs and is afebrile.  Physical examination basically unremarkable.  Laboratory studies obtained including CBC, basic metabolic panel, and troponin, all of which are basically normal.  Chest x-ray showing no acute process.  At this point, I feel as though patient can safely be discharged.  I highly doubt a cardiac etiology as she has had 2 workups, one here and 1 in [place] in the past 3 days that were all normal.  I will refer her to cardiology for evaluation.     Final diagnoses:  None    ED Discharge Orders     None          Geroldine Berg, MD 02/12/24 [phone]

## 2024-02-12 NOTE — Discharge Instructions (Addendum)
 Take ibuprofen  600 mg every 6 hours as needed for pain.  Follow-up with cardiology.  The contact information for the cardiology clinic in [place] has been provided in this discharge summary for you to call and make these arrangements.

## 2024-02-12 NOTE — ED Notes (Signed)
CBC redrawn due to clotting

## 2024-02-12 NOTE — ED Triage Notes (Signed)
 Pt to ED for worsening CP that has been on going for a couple weeks. Recently treated at [facility] ED on Friday and discharged feeling worse Pt states she feels like she's having an anxiety attack along with chest pressure and pain with radiation down left arm and right leg. With intermittent SOB and dizziness.
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
## 2024-03-11 ENCOUNTER — Encounter: Payer: Self-pay | Admitting: Gastroenterology

## 2024-03-12 ENCOUNTER — Ambulatory Visit: Attending: General Practice | Admitting: General Practice

## 2024-03-12 ENCOUNTER — Encounter: Payer: Self-pay | Admitting: General Practice

## 2024-03-12 VITALS — BP 123/70 | Ht 62.0 in | Wt 178.0 lb

## 2024-03-12 DIAGNOSIS — R071 Chest pain on breathing: Secondary | ICD-10-CM | POA: Insufficient documentation

## 2024-03-12 DIAGNOSIS — E782 Mixed hyperlipidemia: Secondary | ICD-10-CM | POA: Diagnosis present

## 2024-03-12 DIAGNOSIS — I10 Essential (primary) hypertension: Secondary | ICD-10-CM | POA: Insufficient documentation

## 2024-03-12 DIAGNOSIS — R0789 Other chest pain: Secondary | ICD-10-CM | POA: Diagnosis present

## 2024-03-12 LAB — CBC

## 2024-03-12 MED ORDER — METOPROLOL TARTRATE 100 MG PO TABS: 100.0000 mg | ORAL_TABLET | Freq: Once | ORAL | 0 refills | Status: DC

## 2024-03-12 NOTE — Patient Instructions (Addendum)
 Medication Instructions:   TWO  HOURS PRIOR TO SCHEDULED PROCEDURE : METOPROLOL 100 MG ONE DOSE   *If you need a refill on your cardiac medications before your next appointment, please call your pharmacy*   Lab Work:  PLEASE GO DOWN STAIRS  [facility]  FIRST FLOOR   ( GET OFF ELEVATORS WALK TOWARDS WAITING AREA LAB LOCATED BY PHARMACY): BMET  MAG  AND CBC     If you have labs (blood work) drawn today and your tests are completely normal, you will receive your results only by: MyChart Message (if you have MyChart) OR A paper copy in the mail If you have any lab test that is abnormal or we need to change your treatment, we will call you to review the results.   Testing/Procedures: Non-Cardiac CT Angiography (CTA), is a special type of CT scan that uses a computer to produce multi-dimensional views of major blood vessels throughout the body. In CT angiography, a contrast material is injected through an IV to help visualize the blood vessels     Follow-Up: At [facility], you and your health needs are our priority.  As part of our continuing mission to provide you with exceptional heart care, our providers are all part of one team.  This team includes your primary Cardiologist (physician) and Advanced Practice Providers or APPs (Physician Assistants and Nurse Practitioners) who all work together to provide you with the care you need, when you need it.  Your next appointment:   2 month(s)   Provider:  Josefa Beauvais, NP      We recommend signing up for the patient portal called MyChart.  Sign up information is provided on this After Visit Summary.  MyChart is used to connect with patients for Virtual Visits (Telemedicine).  Patients are able to view lab/test results, encounter notes, upcoming appointments, etc.  Non-urgent messages can be sent to your provider as well.   To learn more about what you can do with MyChart, go to [email or web address].   Other Instructions      Your cardiac CT will be scheduled at one of the below locations:   [facility] [address] [place], [place] [address] [phone] (Severe contrast allergies only)  OR   [facility] [address] [place], [place] [address] [phone]  OR   [facility] [address] [place], [place] [address] [phone]  OR   [facility] [address]  [place], [place] [address] [phone]  OR   [facility] [address] [place], [place] [phone]  If scheduled at [facility], please arrive at the [facility] and Children's Entrance (Entrance C2) of [facility] 30 minutes prior to test start time. You can use the FREE valet parking offered at entrance C (encouraged to control the heart rate for the test)  Proceed to the [facility] Radiology Department (first floor) to check-in and test prep.  All radiology patients and guests should use entrance C2 at [facility], accessed from [facility], even though the hospital's physical address listed is [address]  If scheduled at the Heart and Vascular Tower at [address], please enter the parking lot using the [address] entrance and use the FREE valet service at the patient drop-off area. Enter the building and check-in with registration on the main floor.  If scheduled at [facility], please arrive to  the Heart and Vascular Center 15 mins early for check-in and test prep.  There is spacious parking and easy access to the radiology department from the [facility] Heart and Vascular entrance. Please enter here and check-in with the desk attendant.   If scheduled at [facility], please arrive 30 minutes early for check-in and test prep.  Please follow these instructions carefully (unless otherwise directed):  An IV will be required for this test and  Nitroglycerin will be given.     On the Night Before the Test:  Be sure to Drink plenty of water. Do not consume any caffeinated/decaffeinated beverages or chocolate 12 hours prior to your test. Do not take any antihistamines 12 hours prior to your test.   On the Day of the Test:  Drink plenty of water until 1 hour prior to the test. Do not eat any food 1 hour prior to test. You may take your regular medications prior to the test.  Take metoprolol (Lopressor) two hours prior to test. If you take Furosemide/Hydrochlorothiazide /Spironolactone/Chlorthalidone, please HOLD on the morning of the test. Patients who wear a continuous glucose monitor MUST remove the device prior to scanning. FEMALES- please wear underwire-free bra if available, avoid dresses & tight clothing         After the Test: Drink plenty of water. After receiving IV contrast, you may experience a mild flushed feeling. This is normal. On occasion, you may experience a mild rash up to 24 hours after the test. This is not dangerous. If this occurs, you can take Benadryl 25 mg, Zyrtec, Claritin, or Allegra and increase your fluid intake. (Patients taking Tikosyn should avoid Benadryl, and may take Zyrtec, Claritin, or Allegra) If you experience trouble breathing, this can be serious. If it is severe call 911 IMMEDIATELY. If it is mild, please call our office.  We will call to schedule your test 2-4 weeks out understanding that some insurance companies will need an authorization prior to the service being performed.   For more information and frequently asked questions, please visit our website : [email or web address]  For non-scheduling related questions, please contact the cardiac imaging nurse navigator should you have any questions/concerns: Cardiac Imaging Nurse Navigators Direct Office Dial: [phone]   For scheduling needs, including cancellations and rescheduling, please call [place],  [phone].

## 2024-03-13 ENCOUNTER — Ambulatory Visit: Payer: Self-pay | Admitting: General Practice

## 2024-03-13 DIAGNOSIS — E782 Mixed hyperlipidemia: Secondary | ICD-10-CM

## 2024-03-13 LAB — CBC
Hematocrit: 38.8 % (ref 34.0–46.6)
Hemoglobin: 12.5 g/dL (ref 11.1–15.9)
MCH: 26.7 pg (ref 26.6–33.0)
MCHC: 32.2 g/dL (ref 31.5–35.7)
MCV: 83 fL (ref 79–97)
Platelets: 353 x10E3/uL (ref 150–450)
RBC: 4.68 x10E6/uL (ref 3.77–5.28)
RDW: 13.9 % (ref 11.7–15.4)
WBC: 12.3 x10E3/uL — AB (ref 3.4–10.8)

## 2024-03-13 LAB — BASIC METABOLIC PANEL WITH GFR
BUN/Creatinine Ratio: 9 (ref 9–23)
BUN: 8 mg/dL (ref 6–24)
CO2: 20 mmol/L (ref 20–29)
Calcium: 8.9 mg/dL (ref 8.7–10.2)
Chloride: 101 mmol/L (ref 96–106)
Creatinine, Ser: 0.91 mg/dL (ref 0.57–1.00)
Glucose: 103 mg/dL — ABNORMAL HIGH (ref 70–99)
Potassium: 4.4 mmol/L (ref 3.5–5.2)
Sodium: 138 mmol/L (ref 134–144)
eGFR: 75 mL/min/1.73 (ref >59)

## 2024-03-13 LAB — MAGNESIUM: Magnesium: 2 mg/dL (ref 1.6–2.3)

## 2024-03-21 ENCOUNTER — Other Ambulatory Visit (HOSPITAL_COMMUNITY)

## 2024-04-10 ENCOUNTER — Telehealth (HOSPITAL_COMMUNITY): Payer: Self-pay | Admitting: *Deleted

## 2024-04-10 NOTE — Telephone Encounter (Signed)
 Reaching out to patient to offer assistance regarding upcoming cardiac imaging study; pt verbalizes understanding of appt date/time, parking situation and where to check in, pre-test NPO status and medications ordered, and verified current allergies; name and call back number provided for further questions should they arise  Chantal Requena RN Navigator Cardiac Imaging [facility] [phone] office [phone] cell

## 2024-04-11 ENCOUNTER — Ambulatory Visit (INDEPENDENT_AMBULATORY_CARE_PROVIDER_SITE_OTHER)
Admission: RE | Admit: 2024-04-11 | Discharge: 2024-04-11 | Disposition: A | Discharge status: Home / Self Care | Source: Ambulatory Visit | Attending: General Practice | Admitting: General Practice

## 2024-04-11 DIAGNOSIS — R071 Chest pain on breathing: Secondary | ICD-10-CM

## 2024-04-11 DIAGNOSIS — I2510 Atherosclerotic heart disease of native coronary artery without angina pectoris: Secondary | ICD-10-CM | POA: Diagnosis not present

## 2024-04-11 MED ORDER — NITROGLYCERIN 0.4 MG SL SUBL
0.8000 mg | SUBLINGUAL_TABLET | Freq: Once | SUBLINGUAL | Status: AC
  Administered 2024-04-11: 0.8 mg via SUBLINGUAL

## 2024-04-11 MED ORDER — IOHEXOL 350 MG/ML SOLN
100.0000 mL | Freq: Once | INTRAVENOUS | Status: AC | PRN
  Administered 2024-04-11: 100 mL via INTRAVENOUS

## 2024-04-14 ENCOUNTER — Inpatient Hospital Stay (INDEPENDENT_AMBULATORY_CARE_PROVIDER_SITE_OTHER)
Admission: RE | Admit: 2024-04-14 | Discharge: 2024-04-14 | Disposition: A | Discharge status: Home / Self Care | Payer: Self-pay | Source: Ambulatory Visit | Attending: Cardiology | Admitting: Cardiology

## 2024-04-14 ENCOUNTER — Other Ambulatory Visit: Payer: Self-pay | Admitting: Cardiology

## 2024-04-14 DIAGNOSIS — R931 Abnormal findings on diagnostic imaging of heart and coronary circulation: Secondary | ICD-10-CM

## 2024-04-14 NOTE — Progress Notes (Unsigned)
 FFR Order

## 2024-04-16 ENCOUNTER — Ambulatory Visit: Payer: Self-pay | Admitting: Cardiology

## 2024-04-22 ENCOUNTER — Other Ambulatory Visit: Payer: Self-pay

## 2024-04-22 DIAGNOSIS — E782 Mixed hyperlipidemia: Secondary | ICD-10-CM

## 2024-04-22 MED ORDER — ATORVASTATIN CALCIUM 40 MG PO TABS: 40.0000 mg | ORAL_TABLET | Freq: Every day | ORAL | 1 refills | Status: AC
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
## 2024-04-30 ENCOUNTER — Other Ambulatory Visit (INDEPENDENT_AMBULATORY_CARE_PROVIDER_SITE_OTHER)

## 2024-04-30 ENCOUNTER — Other Ambulatory Visit: Payer: Self-pay

## 2024-04-30 ENCOUNTER — Ambulatory Visit: Admitting: Gastroenterology

## 2024-04-30 ENCOUNTER — Ambulatory Visit: Payer: Self-pay | Admitting: Gastroenterology

## 2024-04-30 ENCOUNTER — Encounter: Payer: Self-pay | Admitting: Gastroenterology

## 2024-04-30 VITALS — BP 110/70 | HR 64 | Ht 60.0 in | Wt 181.0 lb

## 2024-04-30 DIAGNOSIS — R198 Other specified symptoms and signs involving the digestive system and abdomen: Secondary | ICD-10-CM

## 2024-04-30 DIAGNOSIS — R1310 Dysphagia, unspecified: Secondary | ICD-10-CM | POA: Diagnosis not present

## 2024-04-30 DIAGNOSIS — K219 Gastro-esophageal reflux disease without esophagitis: Secondary | ICD-10-CM | POA: Diagnosis not present

## 2024-04-30 DIAGNOSIS — R1011 Right upper quadrant pain: Secondary | ICD-10-CM

## 2024-04-30 LAB — COMPREHENSIVE METABOLIC PANEL WITH GFR
ALT: 11 U/L (ref 0–35)
AST: 11 U/L (ref 0–37)
Albumin: 4.2 g/dL (ref 3.5–5.2)
Alkaline Phosphatase: 77 U/L (ref 39–117)
BUN: 12 mg/dL (ref 6–23)
CO2: 27 meq/L (ref 19–32)
Calcium: 9.3 mg/dL (ref 8.4–10.5)
Chloride: 100 meq/L (ref 96–112)
Creatinine, Ser: 0.79 mg/dL (ref 0.40–1.20)
GFR: 85.51 mL/min (ref >60.00)
Glucose, Bld: 112 mg/dL — ABNORMAL HIGH (ref 70–99)
Potassium: 3.8 meq/L (ref 3.5–5.1)
Sodium: 137 meq/L (ref 135–145)
Total Bilirubin: 0.4 mg/dL (ref 0.2–1.2)
Total Protein: 7.6 g/dL (ref 6.0–8.3)

## 2024-04-30 LAB — CBC WITH DIFFERENTIAL/PLATELET
Basophils Absolute: 0.1 K/uL (ref 0.0–0.1)
Basophils Relative: 0.4 % (ref 0.0–3.0)
Eosinophils Absolute: 0.1 K/uL (ref 0.0–0.7)
Eosinophils Relative: 0.8 % (ref 0.0–5.0)
HCT: 41.7 % (ref 36.0–46.0)
Hemoglobin: 13.6 g/dL (ref 12.0–15.0)
Lymphocytes Relative: 21 % (ref 12.0–46.0)
Lymphs Abs: 2.7 K/uL (ref 0.7–4.0)
MCHC: 32.6 g/dL (ref 30.0–36.0)
MCV: 78.7 fl (ref 78.0–100.0)
Monocytes Absolute: 0.6 K/uL (ref 0.1–1.0)
Monocytes Relative: 4.5 % (ref 3.0–12.0)
Neutro Abs: 9.5 K/uL — ABNORMAL HIGH (ref 1.4–7.7)
Neutrophils Relative %: 73.3 % (ref 43.0–77.0)
Platelets: 351 K/uL (ref 150.0–400.0)
RBC: 5.29 Mil/uL — ABNORMAL HIGH (ref 3.87–5.11)
RDW: 15 % (ref 11.5–15.5)
WBC: 12.9 K/uL — ABNORMAL HIGH (ref 4.0–10.5)

## 2024-04-30 LAB — TSH: TSH: 1.61 u[IU]/mL (ref 0.35–5.50)

## 2024-04-30 MED ORDER — OMEPRAZOLE 40 MG PO CPDR: 40.0000 mg | DELAYED_RELEASE_CAPSULE | Freq: Two times a day (BID) | ORAL | 3 refills | Status: AC

## 2024-04-30 MED ORDER — POLYETHYLENE GLYCOL 3350 17 G PO PACK: 17.0000 g | PACK | Freq: Every day | ORAL | Status: AC

## 2024-04-30 NOTE — Progress Notes (Signed)
 Agree with assessment and plan as outlined.

## 2024-04-30 NOTE — Patient Instructions (Addendum)
 Start taking Miralax 1 capful (17 grams) 1x / day for 1 week.   If this is not effective, increase to 1 dose 2x / day for 1 week.   If this is still not effective, increase to two capfuls (34 grams) 2x / day.   Can adjust dose as needed based on response. Can take 1/2 cap daily, skip days, or increase per day.    A high fiber diet with plenty of fluids (up to 8 glasses of water daily) is suggested to relieve these symptoms.  benefiber, 1 tablespoon once or twice daily can be used to keep bowels regular if needed.   Please go to the lab in the basement of our building to have lab work done as you leave today. Hit B for basement when you get on the elevator.  When the doors open the lab is on your left.  We will call you with the results. Thank you.   You have been scheduled for a Barium Esophogram at [facility] (1st floor of the hospital) on Friday, 05-03-24 at 9:00am. Please arrive 15 minutes prior to your appointment for registration. Make certain not to have anything to eat or drink 3 hours prior to your test. If you need to reschedule for any reason, please contact radiology at [phone] to do so. __________________________________________________________________ A barium swallow is an examination that concentrates on views of the esophagus. This tends to be a double contrast exam (barium and two liquids which, when combined, create a gas to distend the wall of the oesophagus) or single contrast (non-ionic iodine based). The study is usually tailored to your symptoms so a good history is essential. Attention is paid during the study to the form, structure and configuration of the esophagus, looking for functional disorders (such as aspiration, dysphagia, achalasia, motility and reflux) EXAMINATION You may be asked to change into a gown, depending on the type of swallow being performed. A radiologist and radiographer will perform the procedure. The radiologist will advise you of  the type of contrast selected for your procedure and direct you during the exam. You will be asked to stand, sit or lie in several different positions and to hold a small amount of fluid in your mouth before being asked to swallow while the imaging is performed .In some instances you may be asked to swallow barium coated marshmallows to assess the motility of a solid food bolus. The exam can be recorded as a digital or video fluoroscopy procedure. POST PROCEDURE It will take 1-2 days for the barium to pass through your system. To facilitate this, it is important, unless otherwise directed, to increase your fluids for the next 24-48hrs and to resume your normal diet.  This test typically takes about 30 minutes to perform. __________________________________________________________________________________  We have sent the following medications to your pharmacy for you to pick up at your convenience: Increase omeprazole to 40 mg twice a day.  You have been scheduled for a follow up appointment on Tuesday, 06-25-24 at 8:20am. Please arrive 10 minutes early for registration. If you need to reschedule or cancel this appointment please call [phone] as soon as possible. Thank you.   Thank you for trusting me with your gastrointestinal care!   Nestor Blower, PA  _______________________________________________________  If your blood pressure at your visit was 140/90 or greater, please contact your primary care physician to follow up on this.  _______________________________________________________  If you are age 50 or older, your body mass index should be  between 23-30. Your Body mass index is 35.35 kg/m. If this is out of the aforementioned range listed, please consider follow up with your Primary Care Provider.  If you are age 25 or younger, your body mass index should be between 19-25. Your Body mass index is 35.35 kg/m. If this is out of the aformentioned range listed, please consider  follow up with your Primary Care Provider.   ________________________________________________________  The [place] [facility] providers would like to encourage you to use MYCHART to communicate with providers for non-urgent requests or questions.  Due to long hold times on the telephone, sending your provider a message by [facility] may be a faster and more efficient way to get a response.  Please allow 48 business hours for a response.  Please remember that this is for non-urgent requests.  _______________________________________________________  [facility] is using a team-based approach to care.  Your team is made up of your doctor and two to three APPS. Our APPS (Nurse Practitioners and Physician Assistants) work with your physician to ensure care continuity for you. They are fully qualified to address your health concerns and develop a treatment plan. They communicate directly with your gastroenterologist to care for you. Seeing the Advanced Practice Practitioners on your physician's team can help you by facilitating care more promptly, often allowing for earlier appointments, access to diagnostic testing, procedures, and other specialty referrals.

## 2024-05-01 NOTE — Telephone Encounter (Signed)
 Patient reviewed MyChart message 04/30/24.

## 2024-05-03 ENCOUNTER — Ambulatory Visit (HOSPITAL_COMMUNITY)
Admission: RE | Admit: 2024-05-03 | Discharge: 2024-05-03 | Disposition: A | Discharge status: Home / Self Care | Source: Ambulatory Visit | Attending: Gastroenterology | Admitting: Gastroenterology

## 2024-05-03 DIAGNOSIS — K219 Gastro-esophageal reflux disease without esophagitis: Secondary | ICD-10-CM | POA: Insufficient documentation

## 2024-05-03 DIAGNOSIS — R1310 Dysphagia, unspecified: Secondary | ICD-10-CM | POA: Insufficient documentation
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
## 2024-05-07 ENCOUNTER — Ambulatory Visit

## 2024-05-07 ENCOUNTER — Ambulatory Visit: Attending: General Practice

## 2024-05-07 VITALS — BP 142/84 | HR 65 | Ht 62.0 in | Wt 182.2 lb

## 2024-05-07 DIAGNOSIS — R002 Palpitations: Secondary | ICD-10-CM | POA: Insufficient documentation

## 2024-05-07 DIAGNOSIS — I10 Essential (primary) hypertension: Secondary | ICD-10-CM | POA: Diagnosis present

## 2024-05-07 DIAGNOSIS — R0789 Other chest pain: Secondary | ICD-10-CM | POA: Diagnosis present

## 2024-05-07 DIAGNOSIS — E782 Mixed hyperlipidemia: Secondary | ICD-10-CM | POA: Insufficient documentation

## 2024-05-07 MED ORDER — AMLODIPINE BESYLATE 2.5 MG PO TABS: 2.5000 mg | ORAL_TABLET | Freq: Every day | ORAL | 3 refills | Status: AC

## 2024-05-07 NOTE — Progress Notes (Unsigned)
 Enrolled for [employer] to mail a ZIO XT long term holter monitor to the patients address on file.   Dr. Allyson Sabal to read.

## 2024-05-07 NOTE — Patient Instructions (Signed)
 Medication Instructions:  Start Amlodipine  2.5 mg once daily *If you need a refill on your cardiac medications before your next appointment, please call your pharmacy*  Lab Work: NONE If you have labs (blood work) drawn today and your tests are completely normal, you will receive your results only by: MyChart Message (if you have MyChart) OR A paper copy in the mail If you have any lab test that is abnormal or we need to change your treatment, we will call you to review the results.  Testing/Procedures: 14 Day Zio Heart Monitor Your physician has requested that you wear a Zio heart monitor for ___14__ days. This will be mailed to your home with instructions on how to apply the monitor and how to return it when finished. Please allow 2 weeks after returning the heart monitor before our office calls you with the results.   Follow-Up: At [facility], you and your health needs are our priority.  As part of our continuing mission to provide you with exceptional heart care, our providers are all part of one team.  This team includes your primary Cardiologist (physician) and Advanced Practice Providers or APPs (Physician Assistants and Nurse Practitioners) who all work together to provide you with the care you need, when you need it.  Your next appointment:   8 weeks (Jan 22nd)  Provider:   Emelia, NP  We recommend signing up for the patient portal called MyChart.  Sign up information is provided on this After Visit Summary.  MyChart is used to connect with patients for Virtual Visits (Telemedicine).  Patients are able to view lab/test results, encounter notes, upcoming appointments, etc.  Non-urgent messages can be sent to your provider as well.   To learn more about what you can do with MyChart, go to [email or web address].   Other Instructions ZIO XT- Long Term Monitor Instructions  Your physician has requested you wear a ZIO patch monitor for 14 days.  This is a single  patch monitor. [employer] supplies one patch monitor per enrollment. Additional stickers are not available. Please do not apply patch if you will be having a Nuclear Stress Test,  Echocardiogram, Cardiac CT, MRI, or Chest Xray during the period you would be wearing the  monitor. The patch cannot be worn during these tests. You cannot remove and re-apply the  ZIO XT patch monitor.  Your ZIO patch monitor will be mailed 3 day USPS to your address on file. It may take 3-5 days  to receive your monitor after you have been enrolled.  Once you have received your monitor, please review the enclosed instructions. Your monitor  has already been registered assigning a specific monitor serial # to you.  Billing and Patient Assistance Program Information  We have supplied [employer] with any of your insurance information on file for billing purposes. [employer] offers a sliding scale Patient Assistance Program for patients that do not have  insurance, or whose insurance does not completely cover the cost of the ZIO monitor.  You must apply for the Patient Assistance Program to qualify for this discounted rate.  To apply, please call [employer] at [phone], select option 4, select option 2, ask to apply for  Patient Assistance Program. Meredeth will ask your household income, and how many people  are in your household. They will quote your out-of-pocket cost based on that information.  [employer] will also be able to set up a 46-month, interest-free payment plan if needed.  Applying the monitor   Shave hair  from upper left chest.  Hold abrader disc by orange tab. Rub abrader in 40 strokes over the upper left chest as  indicated in your monitor instructions.  Clean area with 4 enclosed alcohol pads. Let dry.  Apply patch as indicated in monitor instructions. Patch will be placed under collarbone on left  side of chest with arrow pointing upward.  Rub patch adhesive wings for 2 minutes. Remove white label marked  1. Remove the white  label marked 2. Rub patch adhesive wings for 2 additional minutes.  While looking in a mirror, press and release button in center of patch. A small green light will  flash 3-4 times. This will be your only indicator that the monitor has been turned on.  Do not shower for the first 24 hours. You may shower after the first 24 hours.  Press the button if you feel a symptom. You will hear a small click. Record Date, Time and  Symptom in the Patient Logbook.  When you are ready to remove the patch, follow instructions on the last 2 pages of Patient  Logbook. Stick patch monitor onto the last page of Patient Logbook.  Place Patient Logbook in the blue and white box. Use locking tab on box and tape box closed  securely. The blue and white box has prepaid postage on it. Please place it in the mailbox as  soon as possible. Your physician should have your test results approximately 7 days after the  monitor has been mailed back to [facility].  Call [facility] Customer Care at [phone] if you have questions regarding  your ZIO XT patch monitor. Call them immediately if you see an orange light blinking on your  monitor.  If your monitor falls off in less than 4 days, contact our Monitor department at [phone].  If your monitor becomes loose or falls off after 4 days call [employer] at [phone] for  suggestions on securing your monitor

## 2024-05-13 ENCOUNTER — Ambulatory Visit (INDEPENDENT_AMBULATORY_CARE_PROVIDER_SITE_OTHER)
Admission: RE | Admit: 2024-05-13 | Discharge: 2024-05-13 | Disposition: A | Source: Ambulatory Visit | Attending: Gastroenterology | Admitting: Radiology

## 2024-05-13 DIAGNOSIS — R1011 Right upper quadrant pain: Secondary | ICD-10-CM

## 2024-05-16 ENCOUNTER — Other Ambulatory Visit: Payer: Self-pay

## 2024-05-16 ENCOUNTER — Ambulatory Visit: Payer: Self-pay | Admitting: Gastroenterology

## 2024-05-16 DIAGNOSIS — R198 Other specified symptoms and signs involving the digestive system and abdomen: Secondary | ICD-10-CM

## 2024-05-16 DIAGNOSIS — Z1211 Encounter for screening for malignant neoplasm of colon: Secondary | ICD-10-CM

## 2024-05-16 DIAGNOSIS — R1011 Right upper quadrant pain: Secondary | ICD-10-CM

## 2024-05-16 MED ORDER — NA SULFATE-K SULFATE-MG SULF 17.5-3.13-1.6 GM/177ML PO SOLN: 1.0000 | Freq: Once | ORAL | 0 refills | Status: AC

## 2024-05-16 NOTE — Progress Notes (Signed)
 Called and spoke to patient. Relayed results and recs.  Patients indicates she is still doing very badly.  She is getting sick after every meal and only eating once a day.  She is not taking anything for nausea.  She says she stays bloated all the time and her stomach hurts all the time (under her breast on the R side), feels like someone is punching her.  She indicates she has not had a bowel movement in 5 days and when she goes it hurts. She is hesitant to use Miralax  bc in the past it has made her go too much and she is working and is afraid to not be able to get to the bathroom in time.  I encouraged her to increase her water intake and take at least 1/2 dose of Miralax  everyday to try to get her bowels moving.  Patient wants to know what can be done - she is miserable. She is open to scheduling a colonoscopy but will have to find a ride - may be able to find a friend to bring her.  Patient was advised re: [facility] as an option.  She does check MyChart but not when she is at work.

## 2024-06-17 ENCOUNTER — Ambulatory Visit

## 2024-06-17 VITALS — Ht 62.0 in | Wt 187.0 lb

## 2024-06-17 DIAGNOSIS — Z1211 Encounter for screening for malignant neoplasm of colon: Secondary | ICD-10-CM

## 2024-06-17 NOTE — Progress Notes (Signed)
 No egg or soy allergy known to patient  No issues known to pt with past sedation with any surgeries or procedures Patient denies ever being told they had issues or difficulty with intubation  No FH of Malignant Hyperthermia Pt is not on diet pills Pt is not on  home 02  Pt is not on blood thinners  Pt denies issues with constipation  No A fib or A flutter Have any cardiac testing pending--yes 07/05/24-Patient understands to call if new diagnosis or new medication is prescribed by cardiologist Pt can ambulate independently Pt denies use of chewing tobacco Discussed diabetic I weight loss medication holds Discussed NSAID holds Checked BMI Pt instructed to use Singlecare[employer]com or GoodRx for a price reduction on prep  Patient's chart reviewed by Norleen Schillings CNRA prior to previsit and patient appropriate for the [facility].  Pre visit completed and red dot placed by patient's name on their procedure day (on provider's schedule).

## 2024-06-25 ENCOUNTER — Ambulatory Visit: Admitting: Physician Assistant

## 2024-06-25 ENCOUNTER — Ambulatory Visit: Admitting: Gastroenterology

## 2024-06-25 NOTE — Progress Notes (Unsigned)
 "    06/25/2024 Claudia Chapman Sar [id number] [birth year]  Referring provider: Silver Lamar LABOR, MD Primary GI doctor: Dr. Leigh  ASSESSMENT AND PLAN:  Chest pain with associated shortness of breath 04/14/2024 CT angiography low likelihood of hemodynamically significant stenosis Undergoing Zio patch  Hiatal hernia with gastroesophageal reflux disease and dysphagia Small hiatal hernia on CTA.   2017 EGD esophagitis/PUD Dr. Donzetta per patient  05/03/2024 barium swallow small moderate sliding hiatal hernia barium tablet passed easily no reflux seen Omeprazole  increased to 4 times daily    Abdominal pain with associated constipation alternating with diarrhea, possible irritable bowel syndrome Symptoms include infrequent bowel movements, hard stools, and occasional diarrhea.  Anxiety likely contributing. Not on a bowel regimen. Bowel purge 12/4 12/9 patient called with continuing abdominal pain    RUQ pain like related to gastritis RUQ pain with eating s/p cholecystectomy 2000 with normal LFTs February 2025.  Possibly GERD related will need further evaluation 05/13/2024 RUQ US  hepatic steatosis unremarkable bile ducts  Colon cancer screening No previous colonoscopy.  No family history of colon cancer.  08/09/2024 colonoscopy scheduled Dr. Leigh   Anxiety Patient reports intense anxiety and depression that is more severe around her medical history and reports frustration when viewing MyChart as it gives her anxiety trying to decipher her most recent CTA.  She recently increased her BuSpar  Patient Care Team: Silver Lamar LABOR, MD as PCP - General (Family Medicine) Court Dorn PARAS, MD as PCP - Cardiology (Cardiology)  HISTORY OF PRESENT ILLNESS: 54 y.o. female with a past medical history listed below presents for evaluation of ***.   Previously seen by PA Con Blower 04/30/2024 for multiple GI issues.  *** Discussed the use of AI scribe software for clinical note  transcription with the patient, who gave verbal consent to proceed.  History of Present Illness            She  reports that she has never smoked. She has never used smokeless tobacco. She reports current alcohol use. She reports that she does not use drugs.  RELEVANT GI HISTORY, IMAGING AND LABS: Results          CBC    Component Value Date/Time   WBC 12.9 (H) 04/30/2024 0936   RBC 5.29 (H) 04/30/2024 0936   HGB 13.6 04/30/2024 0936   HGB 12.5 03/12/2024 0911   HCT 41.7 04/30/2024 0936   HCT 38.8 03/12/2024 0911   PLT 351.0 04/30/2024 0936   PLT 353 03/12/2024 0911   MCV 78.7 04/30/2024 0936   MCV 83 03/12/2024 0911   MCH 26.7 03/12/2024 0911   MCH 26.7 02/12/2024 0133   MCHC 32.6 04/30/2024 0936   RDW 15.0 04/30/2024 0936   RDW 13.9 03/12/2024 0911   LYMPHSABS 2.7 04/30/2024 0936   MONOABS 0.6 04/30/2024 0936   EOSABS 0.1 04/30/2024 0936   BASOSABS 0.1 04/30/2024 0936   Recent Labs    02/12/24 0133 03/12/24 0911 04/30/24 0936  HGB 13.1 12.5 13.6    CMP     Component Value Date/Time   NA 137 04/30/2024 0936   NA 138 03/12/2024 0911   K 3.8 04/30/2024 0936   CL 100 04/30/2024 0936   CO2 27 04/30/2024 0936   GLUCOSE 112 (H) 04/30/2024 0936   BUN 12 04/30/2024 0936   BUN 8 03/12/2024 0911   CREATININE 0.79 04/30/2024 0936   CALCIUM  9.3 04/30/2024 0936   PROT 7.6 04/30/2024 0936   ALBUMIN 4.2 04/30/2024 0936   AST  11 04/30/2024 0936   ALT 11 04/30/2024 0936   ALKPHOS 77 04/30/2024 0936   BILITOT 0.4 04/30/2024 0936   GFRNONAA >60 02/12/2024 0108   GFRAA >60 03/03/2019 0142      Latest Ref Rng & Units 04/30/2024    9:36 AM 03/03/2019    1:42 AM 07/07/2013   11:56 AM  Hepatic Function  Total Protein 6.0 - 8.3 g/dL 7.6  7.1  7.1   Albumin 3.5 - 5.2 g/dL 4.2  3.7  3.7   AST 0 - 37 U/L 11  18  14    ALT 0 - 35 U/L 11  17  12    Alk Phosphatase 39 - 117 U/L 77  74  75   Total Bilirubin 0.2 - 1.2 mg/dL 0.4  0.7  0.6       Current Medications:    Current Outpatient Medications (Cardiovascular):    amLODipine  (NORVASC ) 2.5 MG tablet, Take 1 tablet (2.5 mg total) by mouth daily.   atorvastatin  (LIPITOR) 40 MG tablet, Take 1 tablet (40 mg total) by mouth daily.   triamterene -hydrochlorothiazide  (MAXZIDE -25) 37.5-25 MG tablet, Take 1 tablet by mouth daily.  Current Outpatient Medications (Analgesics):    acetaminophen  (TYLENOL ) 500 MG tablet, Take 500-1,000 mg by mouth every 8 (eight) hours as needed (for pain).   meloxicam  (MOBIC ) 15 MG tablet, Take 15 mg by mouth as needed.  Current Outpatient Medications (Other):    ALPRAZolam  (XANAX ) 0.25 MG tablet, Take 0.25 mg by mouth 2 (two) times daily as needed for anxiety.    busPIRone (BUSPAR) 15 MG tablet, Take 15 mg by mouth 3 (three) times daily. Can take an extra 5 mg when needed   omeprazole  (PRILOSEC) 40 MG capsule, Take 1 capsule (40 mg total) by mouth 2 (two) times daily.   polyethylene glycol (MIRALAX ) 17 g packet, Take 17 g by mouth daily. (Patient not taking: Reported on 06/17/2024)   Vitamin D, Ergocalciferol, (DRISDOL) 1.25 MG (50000 UNIT) CAPS capsule, Take 50,000 Units by mouth once a week.  Medical History:  Past Medical History:  Diagnosis Date   Anxiety    Arthritis    Depression    Dysphagia    says had difficulty swallowing and had to have esophagus stretched   Eating disorder    GERD (gastroesophageal reflux disease)    Headache    High cholesterol    History of stomach ulcers    Hypertension    IBS (irritable bowel syndrome)    Osteoporosis    knees   PONV (postoperative nausea and vomiting)    Sleep apnea    Status post dilation of esophageal narrowing    Allergies: Allergies[1]   Surgical History:  She  has a past surgical history that includes Cholecystectomy; Esophageal dilation; Knee surgery (Left); Lumbar laminectomy/decompression microdiscectomy (Right, 02/10/2017); and Multiple tooth extractions. Family History:  Her family history includes  Alzheimer's disease in her maternal grandmother; Colon polyps in her father; Diabetes in her father; Heart disease in her maternal grandmother; Hyperlipidemia in her father and mother; Skin cancer in her father; Stroke in her maternal grandmother.  REVIEW OF SYSTEMS  : All other systems reviewed and negative except where noted in the History of Present Illness.  PHYSICAL EXAM: There were no vitals taken for this visit. Physical Exam          Alan JONELLE Coombs, PA-C 7:50 AM      [1]  Allergies Allergen Reactions   Bee Venom Anaphylaxis   Sulfa Antibiotics  Anaphylaxis   Vicodin [Hydrocodone-Acetaminophen ] Anaphylaxis, Shortness Of Breath and Swelling    Swelling of the throat   "

## 2024-06-28 ENCOUNTER — Encounter: Admitting: Gastroenterology

## 2024-07-05 ENCOUNTER — Ambulatory Visit: Attending: General Practice

## 2024-07-05 NOTE — Progress Notes (Deleted)
 error

## 2024-08-02 ENCOUNTER — Ambulatory Visit: Admitting: Physician Assistant

## 2024-08-09 ENCOUNTER — Encounter: Admitting: Gastroenterology
